# Patient Record
Sex: Female | Born: 1955 | Race: White | Hispanic: No | Smoking: Current some day smoker
Health system: Southern US, Community
[De-identification: ages and names within clinical notes are randomized; demographics above are authoritative.]

## PROBLEM LIST (undated history)

## (undated) DIAGNOSIS — R748 Abnormal levels of other serum enzymes: Secondary | ICD-10-CM

## (undated) DIAGNOSIS — E079 Disorder of thyroid, unspecified: Secondary | ICD-10-CM

## (undated) DIAGNOSIS — J189 Pneumonia, unspecified organism: Secondary | ICD-10-CM

## (undated) DIAGNOSIS — J449 Chronic obstructive pulmonary disease, unspecified: Secondary | ICD-10-CM

## (undated) DIAGNOSIS — K219 Gastro-esophageal reflux disease without esophagitis: Secondary | ICD-10-CM

## (undated) DIAGNOSIS — Z720 Tobacco use: Secondary | ICD-10-CM

## (undated) HISTORY — PX: WISDOM TOOTH EXTRACTION: SHX21

## (undated) HISTORY — PX: CYST REMOVAL HAND: SHX6279

---

## 1998-07-13 ENCOUNTER — Emergency Department (HOSPITAL_COMMUNITY): Admission: EM | Admit: 1998-07-13 | Discharge: 1998-07-14 | Payer: Self-pay | Admitting: Emergency Medicine

## 1999-01-14 ENCOUNTER — Emergency Department (HOSPITAL_COMMUNITY): Admission: EM | Admit: 1999-01-14 | Discharge: 1999-01-15 | Payer: Self-pay | Admitting: Emergency Medicine

## 2000-05-01 ENCOUNTER — Emergency Department (HOSPITAL_COMMUNITY): Admission: EM | Admit: 2000-05-01 | Discharge: 2000-05-01 | Payer: Self-pay | Admitting: Emergency Medicine

## 2000-10-03 ENCOUNTER — Emergency Department (HOSPITAL_COMMUNITY): Admission: EM | Admit: 2000-10-03 | Discharge: 2000-10-03 | Payer: Self-pay | Admitting: Emergency Medicine

## 2002-07-14 ENCOUNTER — Emergency Department (HOSPITAL_COMMUNITY): Admission: EM | Admit: 2002-07-14 | Discharge: 2002-07-14 | Payer: Self-pay | Admitting: Emergency Medicine

## 2003-02-23 ENCOUNTER — Encounter: Payer: Self-pay | Admitting: Emergency Medicine

## 2003-02-23 ENCOUNTER — Emergency Department (HOSPITAL_COMMUNITY): Admission: EM | Admit: 2003-02-23 | Discharge: 2003-02-23 | Payer: Self-pay | Admitting: Emergency Medicine

## 2004-04-19 ENCOUNTER — Emergency Department (HOSPITAL_COMMUNITY): Admission: EM | Admit: 2004-04-19 | Discharge: 2004-04-19 | Payer: Self-pay | Admitting: Emergency Medicine

## 2012-11-15 ENCOUNTER — Emergency Department (INDEPENDENT_AMBULATORY_CARE_PROVIDER_SITE_OTHER)
Admission: EM | Admit: 2012-11-15 | Discharge: 2012-11-15 | Disposition: A | Discharge status: Home / Self Care | Payer: Self-pay | Source: Home / Self Care | Attending: Emergency Medicine | Admitting: Emergency Medicine

## 2012-11-15 ENCOUNTER — Encounter (HOSPITAL_COMMUNITY): Payer: Self-pay | Admitting: *Deleted

## 2012-11-15 ENCOUNTER — Emergency Department (INDEPENDENT_AMBULATORY_CARE_PROVIDER_SITE_OTHER): Payer: Self-pay

## 2012-11-15 DIAGNOSIS — J111 Influenza due to unidentified influenza virus with other respiratory manifestations: Secondary | ICD-10-CM

## 2012-11-15 DIAGNOSIS — R6889 Other general symptoms and signs: Secondary | ICD-10-CM

## 2012-11-15 HISTORY — DX: Abnormal levels of other serum enzymes: R74.8

## 2012-11-15 HISTORY — DX: Disorder of thyroid, unspecified: E07.9

## 2012-11-15 MED ORDER — ALBUTEROL SULFATE (5 MG/ML) 0.5% IN NEBU
INHALATION_SOLUTION | RESPIRATORY_TRACT | Status: AC
  Filled 2012-11-15: qty 0.5

## 2012-11-15 MED ORDER — ALBUTEROL SULFATE HFA 108 (90 BASE) MCG/ACT IN AERS: 1.0000 | INHALATION_SPRAY | Freq: Four times a day (QID) | RESPIRATORY_TRACT | Status: DC | PRN

## 2012-11-15 MED ORDER — IBUPROFEN 800 MG PO TABS
ORAL_TABLET | ORAL | Status: AC
  Filled 2012-11-15: qty 1

## 2012-11-15 MED ORDER — IBUPROFEN 800 MG PO TABS
800.0000 mg | ORAL_TABLET | Freq: Once | ORAL | Status: AC
  Administered 2012-11-15: 800 mg via ORAL

## 2012-11-15 MED ORDER — ALBUTEROL SULFATE (5 MG/ML) 0.5% IN NEBU
5.0000 mg | INHALATION_SOLUTION | Freq: Once | RESPIRATORY_TRACT | Status: AC
  Administered 2012-11-15: 5 mg via RESPIRATORY_TRACT

## 2012-11-15 MED ORDER — OSELTAMIVIR PHOSPHATE 75 MG PO CAPS: 75.0000 mg | ORAL_CAPSULE | Freq: Two times a day (BID) | ORAL | Status: AC

## 2012-11-15 MED ORDER — IBUPROFEN 600 MG PO TABS: 600.0000 mg | ORAL_TABLET | Freq: Four times a day (QID) | ORAL | Status: DC | PRN

## 2012-11-15 NOTE — ED Provider Notes (Signed)
The the ophthalmologist on-call as unit you on an as you is a History     CSN: [id number]  Arrival date & time 11/15/12  1156   First MD Initiated Contact with Patient 11/15/12 1217      Chief Complaint  Patient presents with  . Influenza    (Consider location/radiation/quality/duration/timing/severity/associated sxs/prior treatment) HPI Comments: Patient presents urgent care this afternoon complaining of a dry cough bodyaches upper congestion and fever since yesterday. Have not been taken any medicines because of recently elevated liver enzymes at her doctor's following up. She denies any shortness of breath, or wheezing but does have a constant heart sound and cough but doesn't let her rest. She's also having body aches and feeling tired having a mild headache.   Patient is a 57 y.o. female presenting with flu symptoms. The history is provided by the patient.  Influenza This is a new problem. The current episode started yesterday. The problem occurs constantly. The problem has been gradually worsening. Pertinent negatives include no shortness of breath. Nothing aggravates the symptoms. She has tried nothing for the symptoms. The treatment provided no relief.    Past Medical History  Diagnosis Date  . Thyroid disease   . Elevated liver enzymes     History reviewed. No pertinent past surgical history.  No family history on file.  History  Substance Use Topics  . Smoking status: Current Every Day Smoker -- 1.0 packs/day    Types: Cigarettes  . Smokeless tobacco: Not on file  . Alcohol Use: No    OB History    Grav Para Term Preterm Abortions TAB SAB Ect Mult Living                  Review of Systems  Constitutional: Positive for fever, chills and appetite change.  HENT: Negative for neck pain and neck stiffness.   Respiratory: Positive for cough. Negative for apnea, choking, chest tightness, shortness of breath, wheezing and stridor.   Musculoskeletal: Positive for  myalgias and arthralgias.  Skin: Negative for rash and wound.  Neurological: Negative for dizziness.    Allergies  Codeine and Tylenol  Home Medications   Current Outpatient Rx  Name  Route  Sig  Dispense  Refill  . SYNTHROID PO   Oral   Take by mouth.         . ALBUTEROL SULFATE HFA 108 (90 BASE) MCG/ACT IN AERS   Inhalation   Inhale 1-2 puffs into the lungs every 6 (six) hours as needed for wheezing.   1 Inhaler   0   . IBUPROFEN 600 MG PO TABS   Oral   Take 1 tablet (600 mg total) by mouth every 6 (six) hours as needed for pain.   30 tablet   0   . OSELTAMIVIR PHOSPHATE 75 MG PO CAPS   Oral   Take 1 capsule (75 mg total) by mouth 2 (two) times daily.   10 capsule   0     BP 145/70  Pulse 120  Temp 102 F (38.9 C) (Oral)  Resp 18  SpO2 93%  Physical Exam  Nursing note and vitals reviewed. Constitutional: Vital signs are normal. She appears well-developed and well-nourished.  Non-toxic appearance. She does not have a sickly appearance. She does not appear ill. No distress.  HENT:  Mouth/Throat: Uvula is midline, oropharynx is clear and moist and mucous membranes are normal.  Eyes: Conjunctivae normal are normal.  Neck: Neck supple. No JVD present.  Cardiovascular:  Normal rate.  Exam reveals no gallop and no friction rub.   No murmur heard. Pulmonary/Chest: Effort normal. She has decreased breath sounds. She has no wheezes. She has no rhonchi. She has no rales.  Abdominal: Soft.  Lymphadenopathy:    She has no cervical adenopathy.  Neurological: She is alert.  Skin: No rash noted. No erythema.    ED Course  Procedures (including critical care time)  Labs Reviewed - No data to display Dg Chest 2 View  11/15/2012  *RADIOLOGY REPORT*  Clinical Data: Cough and fever  CHEST - 2 VIEW  Comparison: None.  Findings: Cardiomediastinal silhouette is within normal limits. The lungs are clear. No pleural effusion.  No pneumothorax.  No acute osseous abnormality.   IMPRESSION: Normal chest.   Original Report Authenticated By: Christiana Pellant, M.D.      1. Influenza-like symptoms       MDM  Patient meets criteria for ILI been started on Tamiflu encouraged use of future. Occurs to discontinue smoking as patient is a heavy smoker. Was also prescribed an albuterol inhaler and instructed about symptoms that should warrant her return for further evaluation. Patient is in no respiratory distress, BUT SYMPTOMATIC WITH UPPER RESPIRATORY SYMPTOMS.    His or her he does and  Jimmie Molly, MD 11/15/12 1435

## 2012-11-15 NOTE — ED Notes (Signed)
C/O harsh sounding cough and fever onset yesterday; unsure how high fevers have been.  Has not been taking any meds due to recent elevated liver enzymes.

## 2014-03-07 ENCOUNTER — Emergency Department (HOSPITAL_COMMUNITY)
Admission: EM | Admit: 2014-03-07 | Discharge: 2014-03-07 | Disposition: A | Discharge status: Home / Self Care | Payer: Self-pay | Attending: Emergency Medicine | Admitting: Emergency Medicine

## 2014-03-07 ENCOUNTER — Emergency Department (HOSPITAL_COMMUNITY): Payer: Self-pay

## 2014-03-07 ENCOUNTER — Encounter (HOSPITAL_COMMUNITY): Payer: Self-pay | Admitting: Emergency Medicine

## 2014-03-07 DIAGNOSIS — W1809XA Striking against other object with subsequent fall, initial encounter: Secondary | ICD-10-CM | POA: Insufficient documentation

## 2014-03-07 DIAGNOSIS — Y93E1 Activity, personal bathing and showering: Secondary | ICD-10-CM | POA: Insufficient documentation

## 2014-03-07 DIAGNOSIS — E079 Disorder of thyroid, unspecified: Secondary | ICD-10-CM | POA: Insufficient documentation

## 2014-03-07 DIAGNOSIS — W010XXA Fall on same level from slipping, tripping and stumbling without subsequent striking against object, initial encounter: Secondary | ICD-10-CM | POA: Insufficient documentation

## 2014-03-07 DIAGNOSIS — Y92009 Unspecified place in unspecified non-institutional (private) residence as the place of occurrence of the external cause: Secondary | ICD-10-CM | POA: Insufficient documentation

## 2014-03-07 DIAGNOSIS — M940 Chondrocostal junction syndrome [Tietze]: Secondary | ICD-10-CM | POA: Insufficient documentation

## 2014-03-07 DIAGNOSIS — F17200 Nicotine dependence, unspecified, uncomplicated: Secondary | ICD-10-CM | POA: Insufficient documentation

## 2014-03-07 DIAGNOSIS — Z79899 Other long term (current) drug therapy: Secondary | ICD-10-CM | POA: Insufficient documentation

## 2014-03-07 DIAGNOSIS — S298XXA Other specified injuries of thorax, initial encounter: Secondary | ICD-10-CM | POA: Insufficient documentation

## 2014-03-07 MED ORDER — TRAMADOL HCL 50 MG PO TABS: 50.0000 mg | ORAL_TABLET | Freq: Four times a day (QID) | ORAL | Status: DC | PRN

## 2014-03-07 NOTE — ED Provider Notes (Signed)
CSN: [id number]     Arrival date & time 03/07/14  1856 History   This chart was scribed for Domenic Moras by Lovena Le Day, ED scribe. This patient was seen in room WTR6/WTR6 and the patient's care was started at [place].  Chief Complaint  Patient presents with  . Fall   The history is provided by the patient. No language interpreter was used.   HPI Comments: Carolyn Johns is a 58 y.o. female who presents to the Emergency Department complaining of stabbing left lateral rib pain after she slipped and fell in her bathroom on a wet floor against the commode 8 days ago, initially had minimal pain but over the past few days has developed w/gradually worsening pain to her left lateral ribs. She describes the pain as someone stabbing and twisting of a knife. She states taking a deep breath or coughing increases her rib pain. She states she has taken ibuprofen with no relief to her symptoms. She denies cough, SOB, back pain, or abdominal pain. She is a current 1ppd smoker. She denies hx of COPD or emphysema.   Past Medical History  Diagnosis Date  . Thyroid disease   . Elevated liver enzymes    History reviewed. No pertinent past surgical history. No family history on file. History  Substance Use Topics  . Smoking status: Current Every Day Smoker -- 1.00 packs/day    Types: Cigarettes  . Smokeless tobacco: Not on file  . Alcohol Use: No   OB History   Grav Para Term Preterm Abortions TAB SAB Ect Mult Living                 Review of Systems  Constitutional: Negative for fever and chills.  Respiratory: Negative for cough and shortness of breath.   Cardiovascular: Negative for chest pain.  Gastrointestinal: Negative for nausea, vomiting and abdominal pain.  Musculoskeletal: Negative for back pain.       Left rib pain  Skin: Negative for rash.  All other systems reviewed and are negative.   Allergies  Codeine and Tylenol  Home Medications   Prior to Admission medications   Medication  Sig Start Date End Date Taking? Authorizing Provider  albuterol (PROVENTIL HFA;VENTOLIN HFA) 108 (90 BASE) MCG/ACT inhaler Inhale 1-2 puffs into the lungs every 6 (six) hours as needed for wheezing. 11/15/12   Rosana Hoes, MD  ibuprofen (ADVIL,MOTRIN) 600 MG tablet Take 1 tablet (600 mg total) by mouth every 6 (six) hours as needed for pain. 11/15/12   Rosana Hoes, MD  Levothyroxine Sodium (SYNTHROID PO) Take by mouth.    Historical Provider, MD   Triage Vitals: BP 125/65  Pulse 96  Temp(Src) 97.5 F (36.4 C) (Oral)  Resp 18  SpO2 98%  Physical Exam  Nursing note and vitals reviewed. Constitutional: She is oriented to person, place, and time. She appears well-developed and well-nourished. No distress.  HENT:  Head: Normocephalic and atraumatic.  Eyes: Conjunctivae are normal. Right eye exhibits no discharge. Left eye exhibits no discharge.  Neck: Normal range of motion.  Cardiovascular: Normal rate.   Pulmonary/Chest: Effort normal and breath sounds normal. No respiratory distress.  Musculoskeletal: Normal range of motion. She exhibits tenderness. She exhibits no edema.  Tenderness to left lateral rib lines. reproducible pain to left lateral rib lines with out emphysema, bruising or crepitus  No edema, no erythema  Neurological: She is alert and oriented to person, place, and time.  Skin: Skin is warm and dry.  Psychiatric: She  has a normal mood and affect. Thought content normal.    ED Course  Procedures (including critical care time) DIAGNOSTIC STUDIES: Oxygen Saturation is 98% on room air, normal by my interpretation.    COORDINATION OF CARE: At 65 PM Discussed treatment plan with patient which includes pain medicine and incentive spirometer, CXR. Patient agrees.   Labs Review Labs Reviewed - No data to display  Imaging Review Dg Chest 2 View  03/07/2014   CLINICAL DATA:  Status post fall 1 week ago now with left axillary region pain  EXAM: CHEST  2 VIEW  COMPARISON:  DG CHEST  2 VIEW dated 11/15/2012  FINDINGS: The lungs remain hyperinflated with hemidiaphragm flattening. Slightly increased lung markings in the retrosternal region are demonstrated on the lateral film but cannot be triangulated clearly on the frontal film though they may lie on the left. The cardiopericardial silhouette is normal in size. The pulmonary vascularity is not engorged. There is no pleural effusion or pneumothorax. The observed portions of the bony thorax exhibit no acute abnormalities.  IMPRESSION: 1. There is hyperinflation consistent with COPD. There may be minimal subsegmental atelectasis in the retrosternal region likely on the left, but it is difficult to localize this precisely on the frontal films. 2. There is no evidence of a pleural effusion or pneumothorax. No acute abnormality of the bony thorax is demonstrated. 3. If the patient's symptoms persist and remain unexplained, chest CT scanning may be the most useful next imaging step.   Electronically Signed   By: David  Martinique   On: 03/07/2014 19:50     EKG Interpretation None      MDM   Final diagnoses:  Costochondritis    BP 108/72  Pulse 70  Temp(Src) 98.1 F (36.7 C) (Oral)  Resp 21  SpO2 93%  I have reviewed nursing notes and vital signs. I personally reviewed the imaging tests through PACS system  I reviewed available ER/hospitalization records thought the EMR   I personally performed the services described in this documentation, which was scribed in my presence. The recorded information has been reviewed and is accurate.      Domenic Moras, PA-C 03/08/14 2003

## 2014-03-07 NOTE — Discharge Instructions (Signed)
Take ibuprofen and ultram for pain.  Use a pillow and press against chest when you cough to decrease discomfort.  Use incentive spirometer 10 times every 3-4 hrs for the next 5 days to decrease risk of developing lung infection from shallow breathing.  Follow up with your doctor for further care.  Avoid smoking.  Costochondritis Costochondritis, sometimes called Tietze syndrome, is a swelling and irritation (inflammation) of the tissue (cartilage) that connects your ribs with your breastbone (sternum). It causes pain in the chest and rib area. Costochondritis usually goes away on its own over time. It can take up to 6 weeks or longer to get better, especially if you are unable to limit your activities. CAUSES  Some cases of costochondritis have no known cause. Possible causes include:  Injury (trauma).  Exercise or activity such as lifting.  Severe coughing. SIGNS AND SYMPTOMS  Pain and tenderness in the chest and rib area.  Pain that gets worse when coughing or taking deep breaths.  Pain that gets worse with specific movements. DIAGNOSIS  Your health care provider will do a physical exam and ask about your symptoms. Chest X-rays or other tests may be done to rule out other problems. TREATMENT  Costochondritis usually goes away on its own over time. Your health care provider may prescribe medicine to help relieve pain. HOME CARE INSTRUCTIONS   Avoid exhausting physical activity. Try not to strain your ribs during normal activity. This would include any activities using chest, abdominal, and side muscles, especially if heavy weights are used.  Apply ice to the affected area for the first 2 days after the pain begins.  Put ice in a plastic bag.  Place a towel between your skin and the bag.  Leave the ice on for 20 minutes, 2 3 times a day.  Only take over-the-counter or prescription medicines as directed by your health care provider. SEEK MEDICAL CARE IF:  You have redness or  swelling at the rib joints. These are signs of infection.  Your pain does not go away despite rest or medicine. SEEK IMMEDIATE MEDICAL CARE IF:   Your pain increases or you are very uncomfortable.  You have shortness of breath or difficulty breathing.  You cough up blood.  You have worse chest pains, sweating, or vomiting.  You have a fever or persistent symptoms for more than 2 3 days.  You have a fever and your symptoms suddenly get worse. MAKE SURE YOU:   Understand these instructions.  Will watch your condition.  Will get help right away if you are not doing well or get worse. Document Released: 08/07/2005 Document Revised: 08/18/2013 Document Reviewed: 06/01/2013 [facility] Patient Information 2014 [place].

## 2014-03-07 NOTE — ED Notes (Signed)
Per pt, states she fell on left side-feels like she cracked a rib-pain with inspiration

## 2014-03-09 NOTE — ED Provider Notes (Signed)
Medical screening examination/treatment/procedure(s) were performed by non-physician practitioner and as supervising physician I was immediately available for consultation/collaboration.    Eshan Trupiano D Jerusalen Mateja, MD 03/09/14 0034 

## 2015-08-16 ENCOUNTER — Emergency Department (HOSPITAL_COMMUNITY)
Admission: EM | Admit: 2015-08-16 | Discharge: 2015-08-16 | Disposition: A | Discharge status: Home / Self Care | Payer: Self-pay | Attending: Emergency Medicine | Admitting: Emergency Medicine

## 2015-08-16 ENCOUNTER — Encounter (HOSPITAL_COMMUNITY): Payer: Self-pay | Admitting: *Deleted

## 2015-08-16 DIAGNOSIS — M5441 Lumbago with sciatica, right side: Secondary | ICD-10-CM | POA: Insufficient documentation

## 2015-08-16 DIAGNOSIS — Z79899 Other long term (current) drug therapy: Secondary | ICD-10-CM | POA: Insufficient documentation

## 2015-08-16 DIAGNOSIS — E039 Hypothyroidism, unspecified: Secondary | ICD-10-CM | POA: Insufficient documentation

## 2015-08-16 DIAGNOSIS — Z720 Tobacco use: Secondary | ICD-10-CM | POA: Insufficient documentation

## 2015-08-16 LAB — URINALYSIS, ROUTINE W REFLEX MICROSCOPIC
BILIRUBIN URINE: NEGATIVE
GLUCOSE, UA: NEGATIVE mg/dL
KETONES UR: NEGATIVE mg/dL
Leukocytes, UA: NEGATIVE
Nitrite: NEGATIVE
PROTEIN: NEGATIVE mg/dL
Specific Gravity, Urine: 1.006 (ref 1.005–1.030)
Urobilinogen, UA: 0.2 mg/dL (ref 0.0–1.0)
pH: 5.5 (ref 5.0–8.0)

## 2015-08-16 LAB — URINE MICROSCOPIC-ADD ON

## 2015-08-16 MED ORDER — PREDNISONE 10 MG PO TABS: ORAL_TABLET | ORAL | Status: DC

## 2015-08-16 MED ORDER — TRAMADOL HCL 50 MG PO TABS: 50.0000 mg | ORAL_TABLET | Freq: Four times a day (QID) | ORAL | Status: DC | PRN

## 2015-08-16 MED ORDER — METHOCARBAMOL 500 MG PO TABS: 500.0000 mg | ORAL_TABLET | Freq: Two times a day (BID) | ORAL | Status: DC

## 2015-08-16 NOTE — Discharge Instructions (Signed)
Prednisone as prescribed until all gone for inflammation. Tramadol for severe pain. Robaxin for muscle spasms. Follow up with primary care doctor for recheck next week.   Lumbosacral Radiculopathy Lumbosacral radiculopathy is a condition that involves the spinal nerves and nerve roots in the low back and bottom of the spine. The condition develops when these nerves and nerve roots move out of place or become inflamed and cause symptoms. CAUSES This condition may be caused by:  Pressure from a disk that bulges out of place (herniated disk). A disk is a plate of cartilage that separates bones in the spine.  Disk degeneration.  A narrowing of the bones of the lower back (spinal stenosis).  A tumor.  An infection.  An injury that places sudden pressure on the disks that cushion the bones of your lower spine. RISK FACTORS This condition is more likely to develop in:  Males aged 30-50 years.  Females aged 64-60 years.  People who lift improperly.  People who are overweight or live a sedentary lifestyle.  People who smoke.  People who perform repetitive activities that strain the spine. SYMPTOMS Symptoms of this condition include:  Pain that goes down from the back into the legs (sciatica). This is the most common symptom. The pain may be worse with sitting, coughing, or sneezing.  Pain and numbness in the arms and legs.  Muscle weakness.  Tingling.  Loss of bladder control or bowel control. DIAGNOSIS This condition is diagnosed with a physical exam and medical history. If the pain is lasting, you may have tests, such as:  MRI scan.  X-ray.  CT scan.  Myelogram.  Nerve conduction study. TREATMENT This condition is often treated with:  Hot packs and ice applied to affected areas.  Stretches to improve flexibility.  Exercises to strengthen back muscles.  Physical therapy.  Pain medicine.  A steroid injection in the spine. In some cases, no treatment is  needed. If the condition is long-lasting (chronic), or if symptoms are severe, treatment may involve surgery or lifestyle changes, such as following a weight loss plan. HOME CARE INSTRUCTIONS Medicines  Take medicines only as directed by your health care provider.  Do not drive or operate heavy machinery while taking pain medicine. Injury Care  Apply a heat pack to the injured area as directed by your health care provider.  Apply ice to the affected area:  Put ice in a plastic bag.  Place a towel between your skin and the bag.  Leave the ice on for 20-30 minutes, every 2 hours while you are awake or as needed. Or, leave the ice on for as long as directed by your health care provider. Other Instructions  If you were shown how to do any exercises or stretches, do them as directed by your health care provider.  If your health care provider prescribed a diet or exercise program, follow it as directed.  Keep all follow-up visits as directed by your health care provider. This is important. SEEK MEDICAL CARE IF:  Your pain does not improve over time even when taking pain medicines. SEEK IMMEDIATE MEDICAL CARE IF:  Your develop severe pain.  Your pain suddenly gets worse.  You develop increasing weakness in your legs.  You lose the ability to control your bladder or bowel.  You have difficulty walking or balancing.  You have a fever.   This information is not intended to replace advice given to you by your health care provider. Make sure you discuss any  questions you have with your health care provider.   Document Released: 10/28/2005 Document Revised: 03/14/2015 Document Reviewed: 10/24/2014 Elsevier Interactive Patient Education [employer].

## 2015-08-16 NOTE — ED Notes (Signed)
PT reports back pain on RT side for 2 weeks . Pt reports the pain comes and goes.

## 2015-08-16 NOTE — ED Provider Notes (Signed)
CSN: [id number]     Arrival date & time 08/16/15  0808 History   First MD Initiated Contact with Patient 08/16/15 0815     Chief Complaint  Patient presents with  . Back Pain     (Consider location/radiation/quality/duration/timing/severity/associated sxs/prior Treatment) HPI Carolyn Johns is a 59 y.o. female presents to emergency department complaining of right lower back pain. Patient states pain started 2 weeks ago, comes and goes. Yesterday was unrelieved with the [facility] powder. Patient states she is unable to sleep all night due to pain. She states the most comfortable position is standing up, pain is worsened with sitting. Pain radiates from right lower back into the right groin and sometimes down the right leg. Denies any urinary symptoms. No difficulty urinating or controlling her bowels. Denies fever or chills. Denies numbness or weakness in extremities. No difficulty ambulating, just pain. Denies history of back issues. Denies abdominal pain, nausea, vomiting.  Past Medical History  Diagnosis Date  . Thyroid disease   . Elevated liver enzymes    History reviewed. No pertinent past surgical history. History reviewed. No pertinent family history. Social History  Substance Use Topics  . Smoking status: Current Every Day Smoker -- 1.00 packs/day    Types: Cigarettes  . Smokeless tobacco: None  . Alcohol Use: No   OB History    No data available     Review of Systems  Constitutional: Negative for fever and chills.  Respiratory: Negative for cough, chest tightness and shortness of breath.   Cardiovascular: Negative for chest pain, palpitations and leg swelling.  Gastrointestinal: Negative for nausea, vomiting, abdominal pain and diarrhea.  Genitourinary: Positive for flank pain. Negative for dysuria, vaginal bleeding, vaginal discharge, vaginal pain and pelvic pain.  Musculoskeletal: Positive for back pain. Negative for myalgias, neck pain and neck stiffness.  Skin: Negative  for rash.  Neurological: Negative for weakness and numbness.  All other systems reviewed and are negative.     Allergies  Codeine and Tylenol  Home Medications   Prior to Admission medications   Medication Sig Start Date End Date Taking? Authorizing Provider  albuterol (PROVENTIL HFA;VENTOLIN HFA) 108 (90 BASE) MCG/ACT inhaler Inhale 1-2 puffs into the lungs every 6 (six) hours as needed for wheezing. 11/15/12   Rosana Hoes, MD  ibuprofen (ADVIL,MOTRIN) 600 MG tablet Take 1 tablet (600 mg total) by mouth every 6 (six) hours as needed for pain. 11/15/12   Rosana Hoes, MD  Levothyroxine Sodium (SYNTHROID PO) Take by mouth.    Historical Provider, MD  traMADol (ULTRAM) 50 MG tablet Take 1 tablet (50 mg total) by mouth every 6 (six) hours as needed. 03/07/14   Domenic Moras, PA-C   BP 132/62 mmHg  Pulse 83  Resp 18  SpO2 100% Physical Exam  Constitutional: She is oriented to person, place, and time. She appears well-developed and well-nourished. No distress.  HENT:  Head: Normocephalic.  Eyes: Conjunctivae are normal.  Neck: Neck supple.  Cardiovascular: Normal rate, regular rhythm and normal heart sounds.   Pulmonary/Chest: Effort normal and breath sounds normal. No respiratory distress. She has no wheezes. She has no rales.  Abdominal: Soft. Bowel sounds are normal. She exhibits no distension. There is no tenderness. There is no rebound.  Right cva tenderness  Musculoskeletal: She exhibits no edema.  No midline lumbar spine tenderness. Tender palpation in the right SI joint and right buttock. Pain with right straight leg raise  Neurological: She is alert and oriented to person, place, and  time.  5/5 and equal lower extremity strength. 2+ and equal patellar reflexes bilaterally. Pt able to dorsiflex bilateral toes and feet with good strength against resistance. Equal sensation bilaterally over thighs and lower legs.   Skin: Skin is warm and dry.  Psychiatric: She has a normal mood and  affect. Her behavior is normal.  Nursing note and vitals reviewed.   ED Course  Procedures (including critical care time) Labs Review Labs Reviewed  URINALYSIS, ROUTINE W REFLEX MICROSCOPIC (NOT AT [facility]) - Abnormal; Notable for the following:    Hgb urine dipstick SMALL (*)    All other components within normal limits  URINE MICROSCOPIC-ADD ON    Imaging Review No results found. I have personally reviewed and evaluated these images and lab results as part of my medical decision-making.   EKG Interpretation None      MDM   Final diagnoses:  Right-sided low back pain with right-sided sciatica    Pt with lower back pain, intermittent for 2 wks. No injuries. neurovascularly intact. No signs of cauda equina. UA negative. Most likely radicular pain. Will try prednisone, ultram, robaxin, follow up with pcp. Return precautions discussed.    Filed Vitals:   08/16/15 0813  BP: 132/62  Pulse: 83  Temp: 98.4 F (36.9 C)  TempSrc: Oral  Resp: 18  SpO2: 100%       Jeannett Senior, PA-C 08/16/15 [place], MD 08/16/15 1159

## 2015-08-16 NOTE — ED Notes (Signed)
Declined W/C at D/C and was escorted to lobby by RN. 

## 2018-01-15 ENCOUNTER — Emergency Department (HOSPITAL_COMMUNITY): Payer: Self-pay

## 2018-01-15 ENCOUNTER — Observation Stay (HOSPITAL_COMMUNITY): Payer: Self-pay

## 2018-01-15 ENCOUNTER — Observation Stay (HOSPITAL_COMMUNITY)
Admission: EM | Admit: 2018-01-15 | Discharge: 2018-01-17 | Disposition: A | Discharge status: Home / Self Care | Payer: Self-pay | Attending: Internal Medicine | Admitting: Internal Medicine

## 2018-01-15 ENCOUNTER — Encounter (HOSPITAL_COMMUNITY): Payer: Self-pay | Admitting: Internal Medicine

## 2018-01-15 DIAGNOSIS — R7989 Other specified abnormal findings of blood chemistry: Secondary | ICD-10-CM | POA: Diagnosis present

## 2018-01-15 DIAGNOSIS — I2510 Atherosclerotic heart disease of native coronary artery without angina pectoris: Secondary | ICD-10-CM

## 2018-01-15 DIAGNOSIS — E039 Hypothyroidism, unspecified: Secondary | ICD-10-CM | POA: Diagnosis present

## 2018-01-15 DIAGNOSIS — R778 Other specified abnormalities of plasma proteins: Secondary | ICD-10-CM | POA: Diagnosis present

## 2018-01-15 DIAGNOSIS — I951 Orthostatic hypotension: Principal | ICD-10-CM | POA: Insufficient documentation

## 2018-01-15 DIAGNOSIS — Z720 Tobacco use: Secondary | ICD-10-CM | POA: Diagnosis present

## 2018-01-15 DIAGNOSIS — R748 Abnormal levels of other serum enzymes: Secondary | ICD-10-CM | POA: Insufficient documentation

## 2018-01-15 DIAGNOSIS — Z79890 Hormone replacement therapy (postmenopausal): Secondary | ICD-10-CM | POA: Insufficient documentation

## 2018-01-15 DIAGNOSIS — R55 Syncope and collapse: Secondary | ICD-10-CM | POA: Diagnosis present

## 2018-01-15 DIAGNOSIS — E785 Hyperlipidemia, unspecified: Secondary | ICD-10-CM | POA: Insufficient documentation

## 2018-01-15 DIAGNOSIS — R918 Other nonspecific abnormal finding of lung field: Secondary | ICD-10-CM | POA: Diagnosis present

## 2018-01-15 DIAGNOSIS — N3000 Acute cystitis without hematuria: Secondary | ICD-10-CM

## 2018-01-15 DIAGNOSIS — I700 Atherosclerosis of aorta: Secondary | ICD-10-CM | POA: Insufficient documentation

## 2018-01-15 DIAGNOSIS — R42 Dizziness and giddiness: Secondary | ICD-10-CM

## 2018-01-15 DIAGNOSIS — F17210 Nicotine dependence, cigarettes, uncomplicated: Secondary | ICD-10-CM | POA: Insufficient documentation

## 2018-01-15 DIAGNOSIS — N390 Urinary tract infection, site not specified: Secondary | ICD-10-CM | POA: Diagnosis present

## 2018-01-15 HISTORY — DX: Tobacco use: Z72.0

## 2018-01-15 HISTORY — DX: Hypothyroidism, unspecified: E03.9

## 2018-01-15 LAB — BASIC METABOLIC PANEL
Anion gap: 10 (ref 5–15)
BUN: 9 mg/dL (ref 6–20)
CO2: 24 mmol/L (ref 22–32)
Calcium: 9.1 mg/dL (ref 8.9–10.3)
Chloride: 105 mmol/L (ref 101–111)
Creatinine, Ser: 0.73 mg/dL (ref 0.44–1.00)
GFR calc Af Amer: >60 mL/min (ref >60)
GFR calc non Af Amer: >60 mL/min (ref >60)
Glucose, Bld: 104 mg/dL — ABNORMAL HIGH (ref 65–99)
Potassium: 4 mmol/L (ref 3.5–5.1)
Sodium: 139 mmol/L (ref 135–145)

## 2018-01-15 LAB — URINALYSIS, ROUTINE W REFLEX MICROSCOPIC
Bilirubin Urine: NEGATIVE
Glucose, UA: NEGATIVE mg/dL
Ketones, ur: NEGATIVE mg/dL
Nitrite: NEGATIVE
Protein, ur: NEGATIVE mg/dL
Specific Gravity, Urine: 1.009 (ref 1.005–1.030)
pH: 5 (ref 5.0–8.0)

## 2018-01-15 LAB — I-STAT TROPONIN, ED: TROPONIN I, POC: 0.13 ng/mL — AB (ref 0.00–0.08)

## 2018-01-15 LAB — CBC
HEMATOCRIT: 39.6 % (ref 36.0–46.0)
HEMOGLOBIN: 12.9 g/dL (ref 12.0–15.0)
MCH: 29.8 pg (ref 26.0–34.0)
MCHC: 32.6 g/dL (ref 30.0–36.0)
MCV: 91.5 fL (ref 78.0–100.0)
Platelets: 237 10*3/uL (ref 150–400)
RBC: 4.33 MIL/uL (ref 3.87–5.11)
RDW: 13.9 % (ref 11.5–15.5)
WBC: 6.4 10*3/uL (ref 4.0–10.5)

## 2018-01-15 LAB — D-DIMER, QUANTITATIVE: D-Dimer, Quant: 1.31 ug/mL-FEU — ABNORMAL HIGH (ref 0.00–0.50)

## 2018-01-15 MED ORDER — MORPHINE SULFATE (PF) 4 MG/ML IV SOLN: 2.0000 mg | INTRAVENOUS | Status: DC | PRN

## 2018-01-15 MED ORDER — SODIUM CHLORIDE 0.9 % IV SOLN
INTRAVENOUS | Status: DC
  Administered 2018-01-15 – 2018-01-17 (×3): via INTRAVENOUS

## 2018-01-15 MED ORDER — ENOXAPARIN SODIUM 40 MG/0.4ML ~~LOC~~ SOLN
40.0000 mg | SUBCUTANEOUS | Status: DC
  Administered 2018-01-15 – 2018-01-16 (×2): 40 mg via SUBCUTANEOUS
  Filled 2018-01-15 (×2): qty 0.4

## 2018-01-15 MED ORDER — MELATONIN 3 MG PO TABS
9.0000 mg | ORAL_TABLET | Freq: Every evening | ORAL | Status: DC | PRN
  Filled 2018-01-15: qty 3

## 2018-01-15 MED ORDER — LEVOTHYROXINE SODIUM 100 MCG PO TABS
100.0000 ug | ORAL_TABLET | Freq: Every day | ORAL | Status: DC
  Administered 2018-01-16 – 2018-01-17 (×2): 100 ug via ORAL
  Filled 2018-01-15 (×3): qty 1

## 2018-01-15 MED ORDER — NICOTINE 21 MG/24HR TD PT24
21.0000 mg | MEDICATED_PATCH | Freq: Every day | TRANSDERMAL | Status: DC
  Administered 2018-01-15 – 2018-01-17 (×3): 21 mg via TRANSDERMAL
  Filled 2018-01-15 (×3): qty 1

## 2018-01-15 MED ORDER — NITROGLYCERIN 0.4 MG SL SUBL: 0.4000 mg | SUBLINGUAL_TABLET | SUBLINGUAL | Status: DC | PRN

## 2018-01-15 MED ORDER — ASPIRIN 325 MG PO TABS
325.0000 mg | ORAL_TABLET | Freq: Every day | ORAL | Status: DC
  Administered 2018-01-15 – 2018-01-17 (×3): 325 mg via ORAL
  Filled 2018-01-15 (×3): qty 1

## 2018-01-15 MED ORDER — SODIUM CHLORIDE 0.9 % IV SOLN
1.0000 g | INTRAVENOUS | Status: DC
  Administered 2018-01-16 (×2): 1 g via INTRAVENOUS
  Filled 2018-01-15 (×3): qty 10

## 2018-01-15 MED ORDER — IOPAMIDOL (ISOVUE-370) INJECTION 76%
INTRAVENOUS | Status: AC
  Administered 2018-01-15: 100 mL
  Filled 2018-01-15: qty 100

## 2018-01-15 MED ORDER — ONDANSETRON HCL 4 MG/2ML IJ SOLN: 4.0000 mg | Freq: Three times a day (TID) | INTRAMUSCULAR | Status: DC | PRN

## 2018-01-15 MED ORDER — ALPRAZOLAM 0.25 MG PO TABS
0.2500 mg | ORAL_TABLET | Freq: Two times a day (BID) | ORAL | Status: DC | PRN
  Administered 2018-01-15 – 2018-01-16 (×2): 0.25 mg via ORAL
  Filled 2018-01-15 (×2): qty 1

## 2018-01-15 MED ORDER — SODIUM CHLORIDE 0.9 % IV BOLUS (SEPSIS)
1000.0000 mL | Freq: Once | INTRAVENOUS | Status: AC
  Administered 2018-01-15: 1000 mL via INTRAVENOUS

## 2018-01-15 MED ORDER — IBUPROFEN 200 MG PO TABS: 200.0000 mg | ORAL_TABLET | Freq: Four times a day (QID) | ORAL | Status: DC | PRN

## 2018-01-15 MED ORDER — ATORVASTATIN CALCIUM 40 MG PO TABS
40.0000 mg | ORAL_TABLET | Freq: Every day | ORAL | Status: DC
  Administered 2018-01-15 – 2018-01-16 (×2): 40 mg via ORAL
  Filled 2018-01-15 (×2): qty 1

## 2018-01-15 NOTE — H&P (Signed)
History and Physical    Carolyn Johns SFK:[id number] DOB: [birth year] DOA: 01/15/2018  Referring MD/NP/PA:   PCP: Leonard Downing, MD   Patient coming from:  The patient is coming from home.  At baseline, pt is independent for most of ADL.   Chief Complaint: near syncope, burning on urination.   HPI: Carolyn Johns is a 62 y.o. female with medical history significant of tobacco abuse, elevated liver enzymes and hypothyroidism, who presents with near syncope.  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 She states that she almost passed out, but did not. her  symptoms lasted for about 20 minutes. Patient does not have unilateral weakness, numbness or tingling to extremities. No vision change or hearing loss. No slurred speech or facial droop. Patient states that she has burning on urination, but no dysuria or urinary frequency.Patient does not have chest pain, cough, shortness breath, fever or chills. No nausea, vomiting, diarrhea or abdominal pain.  ED Course: pt was found to have positive orthostatic vital sign, positive urinalysis with large amount of leukocyte, troponin 0.13, electrolytes renal function okay, tachycardia, oxygen saturation 93-97% on room air, normal temperature. D-dimer positive at 1.31, but CT angiograms negative for PE. CT head is negative. Pt is placed on tele bed for obs.  CTA of chest showed: 1. No evidence for acute pulmonary embolus. 2. Multiple bilateral pulmonary nodules, largest measures up to 8 Mm. 3. Aortic Atherosclerosis (ICD10-I70.0) and Emphysema (ICD10-J43.9).   Review of Systems:   General: no fevers, chills, no body weight gain, has fatigue HEENT: no blurry vision, hearing changes or sore throat Respiratory: no dyspnea, coughing, wheezing CV: no chest pain, no palpitations GI: no nausea, vomiting, abdominal pain, diarrhea,  constipation GU: no dysuria, has burning on urination, no increased urinary frequency, hematuria  Ext: no leg edema Neuro: no unilateral weakness, numbness, or tingling, no vision change or hearing loss. Has dizziness and lightheadedness. Skin: no rash, no skin tear. MSK: No muscle spasm, no deformity, no limitation of range of movement in spin Heme: No easy bruising.  Travel history: No recent long distant travel.  Allergy:  Allergies  Allergen Reactions  . Codeine     GI upset    Past Medical History:  Diagnosis Date  . Elevated liver enzymes   . Thyroid disease   . Tobacco abuse     Past Surgical History:  Procedure Laterality Date  . CYST REMOVAL HAND      Social History:  reports that she has been smoking cigarettes.  She has been smoking about 1.00 pack per day. She does not have any smokeless tobacco history on file. She reports that she does not drink alcohol or use drugs.  Family History:  Family History  Problem Relation Age of Onset  . Stroke Father   . Seizures Father      Prior to Admission medications   Medication Sig Start Date End Date Taking? Authorizing Provider  levothyroxine (SYNTHROID, LEVOTHROID) 100 MCG tablet Take 100 mcg by mouth daily. 12/24/17  Yes [provider]  Melatonin 5 MG TABS Take 10 mg by mouth at bedtime as needed (sleep).   Yes [provider]  albuterol (PROVENTIL HFA;VENTOLIN HFA) 108 (90 BASE) MCG/ACT inhaler Inhale 1-2 puffs into the lungs every 6 (six) hours as needed for wheezing. Patient not taking: Reported on 01/15/2018 11/15/12   Rosana Hoes,  MD  ibuprofen (ADVIL,MOTRIN) 600 MG tablet Take 1 tablet (600 mg total) by mouth every 6 (six) hours as needed for pain. Patient not taking: Reported on 01/15/2018 11/15/12   Rosana Hoes, MD  methocarbamol (ROBAXIN) 500 MG tablet Take 1 tablet (500 mg total) by mouth 2 (two) times daily. Patient not taking: Reported on 01/15/2018 08/16/15   Jeannett Senior, PA-C  predniSONE  (DELTASONE) 10 MG tablet Take 5 tab day 1, take 4 tab day 2, take 3 tab day 3, take 2 tab day 4, and take 1 tab day 5 Patient not taking: Reported on 01/15/2018 08/16/15   Jeannett Senior, PA-C  traMADol (ULTRAM) 50 MG tablet Take 1 tablet (50 mg total) by mouth every 6 (six) hours as needed. Patient not taking: Reported on 01/15/2018 03/07/14   Domenic Moras, PA-C  traMADol (ULTRAM) 50 MG tablet Take 1 tablet (50 mg total) by mouth every 6 (six) hours as needed. Patient not taking: Reported on 01/15/2018 08/16/15   Jeannett Senior, PA-C    Physical Exam: Vitals:   01/15/18 2000 01/15/18 2030 01/15/18 2100 01/15/18 2149  BP: 128/66 130/82 125/85 122/62  Pulse: 66 70 78 71  Resp: 19 18 (!) 21 20  Temp:    98.7 F (37.1 C)  TempSrc:    Oral  SpO2: 97% 96% 97% 96%  Weight:    55.8 kg (123 lb 1.6 oz)  Height:    5' 3.5" (1.613 m)   General: Not in acute distress HEENT:       Eyes: PERRL, EOMI, no scleral icterus.       ENT: No discharge from the ears and nose, no pharynx injection, no tonsillar enlargement.        Neck: No JVD, no bruit, no mass felt. Heme: No neck lymph node enlargement. Cardiac: S1/S2, RRR, No murmurs, No gallops or rubs. Respiratory:  No rales, wheezing, rhonchi or rubs. GI: Soft, nondistended, nontender, no rebound pain, no organomegaly, BS present. GU: No hematuria Ext: No pitting leg edema bilaterally. 2+DP/PT pulse bilaterally. Musculoskeletal: No joint deformities, No joint redness or warmth, no limitation of ROM in spin. Skin: No rashes.  Neuro: Alert, oriented X3, cranial nerves II-XII grossly intact, moves all extremities normally. Muscle strength 5/5 in all extremities, sensation to light touch intact. Brachial reflex 2+ bilaterally.  Psych: Patient is not psychotic, no suicidal or hemocidal ideation.  Labs on Admission: I have personally reviewed following labs and imaging studies  CBC: Recent Labs  Lab 01/15/18 1517  WBC 6.4  HGB 12.9  HCT 39.6    MCV 91.5  PLT 419   Basic Metabolic Panel: Recent Labs  Lab 01/15/18 1517  NA 139  K 4.0  CL 105  CO2 24  GLUCOSE 104*  BUN 9  CREATININE 0.73  CALCIUM 9.1   GFR: Estimated Creatinine Clearance: 64.1 mL/min (by C-G formula based on SCr of 0.73 mg/dL). Liver Function Tests: No results for input(s): AST, ALT, ALKPHOS, BILITOT, PROT, ALBUMIN in the last 168 hours. No results for input(s): LIPASE, AMYLASE in the last 168 hours. No results for input(s): AMMONIA in the last 168 hours. Coagulation Profile: No results for input(s): INR, PROTIME in the last 168 hours. Cardiac Enzymes: No results for input(s): CKTOTAL, CKMB, CKMBINDEX, TROPONINI in the last 168 hours. BNP (last 3 results) No results for input(s): PROBNP in the last 8760 hours. HbA1C: No results for input(s): HGBA1C in the last 72 hours. CBG: No results for input(s): GLUCAP in the last  168 hours. Lipid Profile: No results for input(s): CHOL, HDL, LDLCALC, TRIG, CHOLHDL, LDLDIRECT in the last 72 hours. Thyroid Function Tests: No results for input(s): TSH, T4TOTAL, FREET4, T3FREE, THYROIDAB in the last 72 hours. Anemia Panel: No results for input(s): VITAMINB12, FOLATE, FERRITIN, TIBC, IRON, RETICCTPCT in the last 72 hours. Urine analysis:    Component Value Date/Time   COLORURINE YELLOW 01/15/2018 1726   APPEARANCEUR HAZY (A) 01/15/2018 1726   LABSPEC 1.009 01/15/2018 1726   PHURINE 5.0 01/15/2018 1726   GLUCOSEU NEGATIVE 01/15/2018 1726   HGBUR MODERATE (A) 01/15/2018 1726   BILIRUBINUR NEGATIVE 01/15/2018 1726   KETONESUR NEGATIVE 01/15/2018 1726   PROTEINUR NEGATIVE 01/15/2018 1726   UROBILINOGEN 0.2 08/16/2015 0854   NITRITE NEGATIVE 01/15/2018 1726   LEUKOCYTESUR LARGE (A) 01/15/2018 1726   Sepsis Labs: @LABRCNTIP (procalcitonin:4,lacticidven:4) )No results found for this or any previous visit (from the past 240 hour(s)).   Radiological Exams on Admission: Ct Head Wo Contrast  Result Date:  01/15/2018 CLINICAL DATA:  Syncopal episode. EXAM: CT HEAD WITHOUT CONTRAST TECHNIQUE: Contiguous axial images were obtained from the base of the skull through the vertex without intravenous contrast. COMPARISON:  None. FINDINGS: Brain: Ventricles and sulci are appropriate for patient's age. No evidence for acute cortically based infarct, intracranial hemorrhage, mass lesion or mass-effect. Vascular: Unremarkable. Skull: Intact. Sinuses/Orbits: Paranasal sinuses are well aerated. Mastoid air cells are unremarkable. Orbits are unremarkable. Other: None. IMPRESSION: No acute intracranial process. Electronically Signed   By: Lovey Newcomer M.D.   On: 01/15/2018 21:20   Ct Angio Chest Pe W/cm &/or Wo Cm  Result Date: 01/15/2018 CLINICAL DATA:  Syncopal episode.  Elevated D-dimer. EXAM: CT ANGIOGRAPHY CHEST WITH CONTRAST TECHNIQUE: Multidetector CT imaging of the chest was performed using the standard protocol during bolus administration of intravenous contrast. Multiplanar CT image reconstructions and MIPs were obtained to evaluate the vascular anatomy. CONTRAST:  55 cc Isovue 370 COMPARISON:  Chest radiograph 03/07/2014. FINDINGS: Cardiovascular: Normal heart size. No pericardial effusion. Thoracic aortic vascular calcifications. Adequate opacification of the pulmonary arterial system. No filling defect to suggest acute pulmonary embolus. Mediastinum/Nodes: No enlarged axillary, mediastinal or hilar lymphadenopathy. Normal esophagus. Lungs/Pleura: Central airways are patent. Dependent atelectasis within the bilateral lower lobes. Multiple bilateral pulmonary nodules. A few reference nodules are as follows including a 5 mm left upper lobe nodule (image 39; series 7), a 5 mm left upper lobe nodule (image 28; series 7) an 8 mm right upper lobe nodule (image 30; series 7), a 6 mm right upper lobe nodule (image 45; series 7) a 4 mm right lower lobe nodule (image 73; series 7) and a 4 mm left lower lobe nodule (image 61;  series 7). No pleural effusion or pneumothorax. Emphysematous change. Upper Abdomen: No acute process. Musculoskeletal: No aggressive or acute appearing osseous lesions. Thoracic spine degenerative changes. Review of the MIP images confirms the above findings. IMPRESSION: 1. No evidence for acute pulmonary embolus. 2. Multiple bilateral pulmonary nodules, largest measures up to 8 mm. Non-contrast chest CT at 3 months is recommended. If the nodules are stable at time of repeat CT, then future CT at 18-24 months (from today's scan) is considered optional for low-risk patients, but is recommended for high-risk patients. This recommendation follows the consensus statement: Guidelines for Management of Incidental Pulmonary Nodules Detected on CT Images: From the Fleischner Society 2017; Radiology 2017; 284:228-243. 3. Aortic Atherosclerosis (ICD10-I70.0) and Emphysema (ICD10-J43.9). Electronically Signed   By: Lovey Newcomer M.D.   On: 01/15/2018 21:28  EKG: Independently reviewed.  Sinus rhythm, QTC 468, anteroseptal infarction pattern, early R-wave progression, nonspecific T-wave change.  Assessment/Plan Principal Problem:   Near syncope Active Problems:   Hypothyroidism   UTI (urinary tract infection)   Elevated troponin   Tobacco abuse   Pulmonary nodules   Near syncope: Etiology is not clear, likely due to multifactorial etiology, including UTI and orthostatic status. No focal neurological findings on physical examination, less likely to have stroke. CT head is negative. CT angiogram is negative for PE. Pt has positive POC trop 0.13, but not chest pain, possibly due to demand ischemia.  -will place on tele bed for obs -IVF: 1L NS, then 125 cc/h -Pt/ot -treat UTI with Abx  UTI: -rocephin -f/u Bx and Ux  Elevated troponin: trop 0.13. No CP. Possibly due to demand ischemia. - prn Nitroglycerin, Morphine, and aspirin, lipitor  - Risk factor stratification: will check FLP, UDS and A1C  - 2d  echo - please call Card in AM - LE doppler to r/o DVT due to positive D-dimer - Inpatient non-urgent card consult order was put in Epic and message to Birdie Sons was sent out.  Hypothyroidism: Last TSH was not on record -Continue home Synthroid -Check TSH  Tobacco abuse: -Did counseling about importance of quitting smoking -Nicotine patch  Pulmonary nodules: Incidental findings on CT angiogram. -f/u with PCP  DVT ppx:  SQ Lovenox Code Status: Full code Family Communication: None at bed side.   Disposition Plan:  Anticipate discharge back to previous home environment Consults called:  none Admission status: Obs / tele         Date of Service 01/15/2018    Ivor Costa [facility] Pager [phone]  If 7PM-7AM, please contact night-coverage [email or web address] Password [facility] 01/15/2018, 11:27 PM

## 2018-01-15 NOTE — ED Notes (Signed)
Patient transported to CT 

## 2018-01-15 NOTE — ED Notes (Signed)
ED Provider at bedside. 

## 2018-01-15 NOTE — ED Provider Notes (Addendum)
[facility] EMERGENCY DEPARTMENT Provider Note   CSN: [id number] Arrival date & time: 01/15/18  1442     History   Chief Complaint Chief Complaint  Patient presents with  . Near Syncope    HPI Carolyn Johns is a 62 y.o. female.  HPI   Patient is a 62 year old female with a history of elevated liver enzymes and hypothyroidism presenting for near syncopal episode today.  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  Patient reports that she felt she was loading to the right side, and sat down.  Patient felt better after eating and drinking and the entire episode lasted approximately 20 minutes.  Patient's been a symptomatically since then.  Patient denies any changes in speech, changes in vision, weakness on one side, facial drooping, or loss of sensation in any part of her body.  Patient denies any active chest pain or shortness of breath accompanying the sensation today.  Patient denies any family history of early MI.  Patient denies any lower extremity leg swelling, recent immobilization, hospitalization, surgeries, hemoptysis, or estrogen use.  No family history of PE.  Patient denies any ischemic cardiac history or ever seeing a cardiologist. Patient does endorse 1.5 ppd of cigarettes.  Past Medical History:  Diagnosis Date  . Elevated liver enzymes   . Thyroid disease     There are no active problems to display for this patient.   No past surgical history on file.  OB History    No data available       Home Medications    Prior to Admission medications   Medication Sig Start Date End Date Taking? Authorizing Provider  levothyroxine (SYNTHROID, LEVOTHROID) 100 MCG tablet Take 100 mcg by mouth daily. 12/24/17  Yes [provider]  Melatonin 5 MG TABS Take 10 mg by mouth at bedtime as needed (sleep).   Yes [provider]    albuterol (PROVENTIL HFA;VENTOLIN HFA) 108 (90 BASE) MCG/ACT inhaler Inhale 1-2 puffs into the lungs every 6 (six) hours as needed for wheezing. Patient not taking: Reported on 01/15/2018 11/15/12   Rosana Hoes, MD  ibuprofen (ADVIL,MOTRIN) 600 MG tablet Take 1 tablet (600 mg total) by mouth every 6 (six) hours as needed for pain. Patient not taking: Reported on 01/15/2018 11/15/12   Rosana Hoes, MD  methocarbamol (ROBAXIN) 500 MG tablet Take 1 tablet (500 mg total) by mouth 2 (two) times daily. Patient not taking: Reported on 01/15/2018 08/16/15   Jeannett Senior, PA-C  predniSONE (DELTASONE) 10 MG tablet Take 5 tab day 1, take 4 tab day 2, take 3 tab day 3, take 2 tab day 4, and take 1 tab day 5 Patient not taking: Reported on 01/15/2018 08/16/15   Jeannett Senior, PA-C  traMADol (ULTRAM) 50 MG tablet Take 1 tablet (50 mg total) by mouth every 6 (six) hours as needed. Patient not taking: Reported on 01/15/2018 03/07/14   Domenic Moras, PA-C  traMADol (ULTRAM) 50 MG tablet Take 1 tablet (50 mg total) by mouth every 6 (six) hours as needed. Patient not taking: Reported on 01/15/2018 08/16/15   Jeannett Senior, PA-C    Family History No family history on file.  Social History Social History   Tobacco Use  . Smoking status: Current Every Day Smoker    Packs/day: 1.00    Types: Cigarettes  Substance Use Topics  . Alcohol  use: No  . Drug use: No     Allergies   Codeine   Review of Systems Review of Systems  Constitutional: Negative for chills and fever.  HENT: Negative for congestion and rhinorrhea.   Eyes: Negative for visual disturbance.  Respiratory: Negative for chest tightness, shortness of breath and wheezing.   Cardiovascular: Negative for chest pain.  Gastrointestinal: Negative for abdominal pain, nausea and vomiting.  Genitourinary: Negative for dysuria.  Musculoskeletal: Negative for myalgias.  Skin: Negative for rash.  Neurological: Positive for dizziness and  light-headedness. Negative for syncope, facial asymmetry, speech difficulty, weakness and headaches.     Physical Exam Updated Vital Signs BP 105/72   Pulse 74   Temp 98.2 F (36.8 C) (Oral)   Resp 19   Ht 5' 3.5" (1.613 m)   Wt 59 kg (130 lb)   SpO2 97%   BMI 22.67 kg/m   Physical Exam  Constitutional: She appears well-developed and well-nourished. No distress.  HENT:  Head: Normocephalic and atraumatic.  Mouth/Throat: Oropharynx is clear and moist.  Eyes: Conjunctivae and EOM are normal. Pupils are equal, round, and reactive to light.  Neck: Normal range of motion. Neck supple.  Cardiovascular: Normal rate, regular rhythm, S1 normal and S2 normal.  No murmur heard. Pulmonary/Chest: Effort normal. She has no wheezes. She has no rales.  Abdominal: Soft. She exhibits no distension. There is no tenderness. There is no guarding.  Musculoskeletal: Normal range of motion. She exhibits no edema or deformity.  Lymphadenopathy:    She has no cervical adenopathy.  Neurological: She is alert.  Mental Status:  Alert, oriented, thought content appropriate, able to give a coherent history. Speech fluent without evidence of aphasia. Able to follow 2 step commands without difficulty.  Cranial Nerves:  II:  Peripheral visual fields grossly normal, pupils equal, round, reactive to light III,IV, VI: ptosis not present, extra-ocular motions intact bilaterally  V,VII: smile symmetric, facial light touch sensation equal VIII: hearing grossly normal to voice  X: uvula elevates symmetrically  XI: bilateral shoulder shrug symmetric and strong XII: midline tongue extension without fassiculations Motor:  Normal tone. 5/5 in upper and lower extremities bilaterally including strong and equal grip strength and dorsiflexion/plantar flexion Sensory: Pinprick and light touch normal in all extremities.  Deep Tendon Reflexes: 2+ and symmetric in the biceps and patella. No clonus. Cerebellar: normal  finger-to-nose with bilateral upper extremities Gait: normal gait and balance Stance: No pronator drift and good coordination, strength, and position sense with tapping of bilateral arms (performed in sitting position). CV: distal pulses palpable throughout   Skin: Skin is warm and dry. No rash noted. No erythema.  Psychiatric: She has a normal mood and affect. Her behavior is normal. Judgment and thought content normal.  Nursing note and vitals reviewed.    ED Treatments / Results  Labs (all labs ordered are listed, but only abnormal results are displayed) Labs Reviewed  BASIC METABOLIC PANEL - Abnormal; Notable for the following components:      Result Value   Glucose, Bld 104 (*)    All other components within normal limits  URINALYSIS, ROUTINE W REFLEX MICROSCOPIC - Abnormal; Notable for the following components:   APPearance HAZY (*)    Hgb urine dipstick MODERATE (*)    Leukocytes, UA LARGE (*)    Bacteria, UA FEW (*)    Squamous Epithelial / LPF 0-5 (*)    All other components within normal limits  CBC  I-STAT TROPONIN, ED  EKG  EKG Interpretation  Date/Time:  Thursday January 15 2018 15:23:26 EST Ventricular Rate:  77 PR Interval:  100 QRS Duration: 88 QT Interval:  414 QTC Calculation: 468 R Axis:   82 Text Interpretation:  Sinus rhythm with short PR Prolonged QT Abnormal ECG Normal sinus rhythm Confirmed by Thomasene Lot, [place] [phone]) on 01/15/2018 7:16:55 PM       Radiology No results found.  Procedures Procedures (including critical care time)  Medications Ordered in ED Medications  sodium chloride 0.9 % bolus 1,000 mL (1,000 mLs Intravenous New Bag/Given 01/15/18 1903)     Initial Impression / Assessment and Plan / ED Course  I have reviewed the triage vital signs and the nursing notes.  Pertinent labs & imaging results that were available during my care of the patient were reviewed by me and considered in my medical decision making (see chart for  details).    Patient is nontoxic-appearing and in no acute distress.  Patient has had no further episodes of syncope or presyncope, chest pain, or shortness of breath.  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  Will obtain troponin and reassess plan.  Patient receives point per [facility] rule for age >22 for PE. No tachycardia, chest pain, tachypnea, or shortness of breath on evaluation. Patient does exhibit orthostasis.  Patient has a normal EKG.  Troponin is elevated to 0.11.  Patient has no active chest pain, or other symptoms at this time.  Due to the elevated troponin, cannot rule out the patient had a cardiac dysrhythmia such as V. fib or V. tach.  We will admit patient for rule out of cardiac dysrhythmia.  Patient is amenable to this plan.  8:42 PM Case discussed with Dr. Blaine Hamper of [facility].  Given that patient has elevated troponin and elevated d-dimer, proceed with CTPA ordered.  Patient to be admitted.  CTPA pending.   Final Clinical Impressions(s) / ED Diagnoses   Final diagnoses:  Lightheadedness  Elevated troponin    ED Discharge Orders    None       Albesa Seen, PA-C 01/16/18 0157    Albesa Seen, PA-C 01/16/18 0158    Macarthur Critchley, MD 01/21/18 [phone]

## 2018-01-15 NOTE — ED Triage Notes (Signed)
Pt states that she was at work when she developed lightheadedness and feeling as if she was going to pass out. The episode lasted for approx 20 minutes. Pt ambulatory in triage with no current complaints at this time.

## 2018-01-16 ENCOUNTER — Other Ambulatory Visit: Payer: Self-pay

## 2018-01-16 ENCOUNTER — Observation Stay (HOSPITAL_BASED_OUTPATIENT_CLINIC_OR_DEPARTMENT_OTHER): Payer: Self-pay

## 2018-01-16 DIAGNOSIS — R55 Syncope and collapse: Secondary | ICD-10-CM

## 2018-01-16 DIAGNOSIS — R918 Other nonspecific abnormal finding of lung field: Secondary | ICD-10-CM

## 2018-01-16 DIAGNOSIS — I360 Nonrheumatic tricuspid (valve) stenosis: Secondary | ICD-10-CM

## 2018-01-16 LAB — RAPID URINE DRUG SCREEN, HOSP PERFORMED
Amphetamines: NOT DETECTED
BARBITURATES: NOT DETECTED
Benzodiazepines: NOT DETECTED
COCAINE: NOT DETECTED
Opiates: NOT DETECTED
Tetrahydrocannabinol: NOT DETECTED

## 2018-01-16 LAB — HEPATIC FUNCTION PANEL
ALT: 8 U/L — ABNORMAL LOW (ref 14–54)
AST: 15 U/L (ref 15–41)
Albumin: 3.5 g/dL (ref 3.5–5.0)
Alkaline Phosphatase: 65 U/L (ref 38–126)
BILIRUBIN TOTAL: 0.4 mg/dL (ref 0.3–1.2)
Total Protein: 6.2 g/dL — ABNORMAL LOW (ref 6.5–8.1)

## 2018-01-16 LAB — TROPONIN I
TROPONIN I: 0.1 ng/mL — AB (ref <0.03)
TROPONIN I: 0.08 ng/mL — AB (ref <0.03)
Troponin I: 0.08 ng/mL (ref <0.03)

## 2018-01-16 LAB — LIPID PANEL
CHOL/HDL RATIO: 5 ratio
Cholesterol: 218 mg/dL — ABNORMAL HIGH (ref 0–200)
HDL: 44 mg/dL (ref >40)
LDL Cholesterol: 147 mg/dL — ABNORMAL HIGH (ref 0–99)
Triglycerides: 135 mg/dL (ref <150)
VLDL: 27 mg/dL (ref 0–40)

## 2018-01-16 LAB — ECHOCARDIOGRAM COMPLETE
Height: 63.5 in
Weight: 1966.4 oz

## 2018-01-16 LAB — TSH: TSH: 4.68 u[IU]/mL — ABNORMAL HIGH (ref 0.350–4.500)

## 2018-01-16 LAB — HIV ANTIBODY (ROUTINE TESTING W REFLEX): HIV SCREEN 4TH GENERATION: NONREACTIVE

## 2018-01-16 NOTE — Progress Notes (Signed)
PROGRESS NOTE    LENAH MESSENGER   AST:[id number]  DOB: December 26, 1955  DOA: 01/15/2018 PCP: Leonard Downing, MD   Brief Narrative:  Carolyn Johns is a 62 y.o. female with medical history significant of tobacco abuse, elevated liver enzymes and hypothyroidism, who presents with near syncope.  She is found to have postive orthostatic vitals and is admitted.    Subjective: She has ambulated in the hall today and no longer feels like she is going to pass out. No other complaints today and asking to go home. When discussing her positive UA, she admitted to increased frequency of micturition and she thought she was developing a hyperactive bladder.  ROS: no complaints of nausea, vomiting, constipation diarrhea, cough, dyspnea or dysuria. No other complaints.   Assessment & Plan:   Principal Problem:   Near syncope - related to orthostatic hypotension and resolved with IVF- advised to maintain hydration with oral liquids  Active Problems: Lung nodules- nicotine abuse - discussed with the patient and explained that she needs a CT scan in 3 months and needs to work on quitting smoking- she agrees to try nicotine patches and I will prescribe them for her on discharge    Elevated troponin - cardiology recommending a stress test tomorrow   Positive D dimer - negative CTA and negative LE venous duplex   Hyperlipidemia - LDL 147 and cholesterol 218- Lipitor started- patient advised to control amount of cholesterol in diet, stop smoking and start an exercise plan    Hypothyroidism - Mildly elevated TSH- cont Synthroid for now    UTI (urinary tract infection) - cont Ceftriaxone   DVT prophylaxis: Lovenox Code Status: Full code Family Communication:  Disposition Plan: cont to follow on telemetry Consultants:   cardiology Procedures:   2 D ECHO Study Conclusions  - Left ventricle: The cavity size was normal. Wall thickness was   normal. Systolic function was normal. The  estimated ejection   fraction was in the range of 55% to 60%. Wall motion was normal;   there were no regional wall motion abnormalities. Doppler   parameters are consistent with abnormal left ventricular   relaxation (grade 1 diastolic dysfunction). The E/e&' ratio is   between 8-15, suggesting indeterminate LV filling pressure. - Mitral valve: Mildly thickened leaflets . There was mild   regurgitation. - Left atrium: The atrium was normal in size. - Inferior vena cava: The vessel was normal in size. The   respirophasic diameter changes were in the normal range (>= 50%),   consistent with normal central venous pressure.  Impressions:  - LVEF 55-60%, normal wall thickness, normal wall motion, grade 1   DD, indeterminate LV filling pressure, mild MR, normal LA size,   normal IVC.   Venous duplex lower extremity  Antimicrobials:  Anti-infectives (From admission, onward)   Start     Dose/Rate Route Frequency Ordered Stop   01/15/18 2100  cefTRIAXone (ROCEPHIN) 1 g in sodium chloride 0.9 % 100 mL IVPB     1 g 200 mL/hr over 30 Minutes Intravenous Every 24 hours 01/15/18 2045         Objective: Vitals:   01/15/18 2100 01/15/18 2149 01/16/18 0440 01/16/18 1224  BP: 125/85 122/62 133/73 133/69  Pulse: 78 71 72 69  Resp: (!) 21 20  18   Temp:  98.7 F (37.1 C) 97.9 F (36.6 C) 98.3 F (36.8 C)  TempSrc:  Oral Oral Oral  SpO2: 97% 96% 97%   Weight:  55.8  kg (123 lb 1.6 oz) 55.7 kg (122 lb 14.4 oz)   Height:  5' 3.5" (1.613 m)      Intake/Output Summary (Last 24 hours) at 01/16/2018 1639 Last data filed at 01/16/2018 1500 Gross per 24 hour  Intake 3357.5 ml  Output 850 ml  Net 2507.5 ml   Filed Weights   01/15/18 1517 01/15/18 2149 01/16/18 0440  Weight: 59 kg (130 lb) 55.8 kg (123 lb 1.6 oz) 55.7 kg (122 lb 14.4 oz)    Examination: General exam: Appears comfortable  HEENT: PERRLA, oral mucosa moist, no sclera icterus or thrush Respiratory system: Clear to  auscultation. Respiratory effort normal. Cardiovascular system: S1 & S2 heard, RRR.  No murmurs  Gastrointestinal system: Abdomen soft, non-tender, nondistended. Normal bowel sound. No organomegaly Central nervous system: Alert and oriented. No focal neurological deficits. Extremities: No cyanosis, clubbing or edema Skin: No rashes or ulcers Psychiatry:  Mood & affect appropriate.     Data Reviewed: I have personally reviewed following labs and imaging studies  CBC: Recent Labs  Lab 01/15/18 1517  WBC 6.4  HGB 12.9  HCT 39.6  MCV 91.5  PLT 867   Basic Metabolic Panel: Recent Labs  Lab 01/15/18 1517  NA 139  K 4.0  CL 105  CO2 24  GLUCOSE 104*  BUN 9  CREATININE 0.73  CALCIUM 9.1   GFR: Estimated Creatinine Clearance: 64.1 mL/min (by C-G formula based on SCr of 0.73 mg/dL). Liver Function Tests: Recent Labs  Lab 01/15/18 2347  AST 15  ALT 8*  ALKPHOS 65  BILITOT 0.4  PROT 6.2*  ALBUMIN 3.5   No results for input(s): LIPASE, AMYLASE in the last 168 hours. No results for input(s): AMMONIA in the last 168 hours. Coagulation Profile: No results for input(s): INR, PROTIME in the last 168 hours. Cardiac Enzymes: Recent Labs  Lab 01/16/18 0404 01/16/18 1205  TROPONINI 0.10* 0.08*   BNP (last 3 results) No results for input(s): PROBNP in the last 8760 hours. HbA1C: No results for input(s): HGBA1C in the last 72 hours. CBG: No results for input(s): GLUCAP in the last 168 hours. Lipid Profile: Recent Labs    01/16/18 0404  CHOL 218*  HDL 44  LDLCALC 147*  TRIG 135  CHOLHDL 5.0   Thyroid Function Tests: Recent Labs    01/16/18 0404  TSH 4.680*   Anemia Panel: No results for input(s): VITAMINB12, FOLATE, FERRITIN, TIBC, IRON, RETICCTPCT in the last 72 hours. Urine analysis:    Component Value Date/Time   COLORURINE YELLOW 01/15/2018 1726   APPEARANCEUR HAZY (A) 01/15/2018 1726   LABSPEC 1.009 01/15/2018 1726   PHURINE 5.0 01/15/2018 1726    GLUCOSEU NEGATIVE 01/15/2018 1726   HGBUR MODERATE (A) 01/15/2018 1726   BILIRUBINUR NEGATIVE 01/15/2018 1726   KETONESUR NEGATIVE 01/15/2018 1726   PROTEINUR NEGATIVE 01/15/2018 1726   UROBILINOGEN 0.2 08/16/2015 0854   NITRITE NEGATIVE 01/15/2018 1726   LEUKOCYTESUR LARGE (A) 01/15/2018 1726   Sepsis Labs: @LABRCNTIP (procalcitonin:4,lacticidven:4) )No results found for this or any previous visit (from the past 240 hour(s)).       Radiology Studies: Ct Head Wo Contrast  Result Date: 01/15/2018 CLINICAL DATA:  Syncopal episode. EXAM: CT HEAD WITHOUT CONTRAST TECHNIQUE: Contiguous axial images were obtained from the base of the skull through the vertex without intravenous contrast. COMPARISON:  None. FINDINGS: Brain: Ventricles and sulci are appropriate for patient's age. No evidence for acute cortically based infarct, intracranial hemorrhage, mass lesion or mass-effect. Vascular: Unremarkable. Skull:  Intact. Sinuses/Orbits: Paranasal sinuses are well aerated. Mastoid air cells are unremarkable. Orbits are unremarkable. Other: None. IMPRESSION: No acute intracranial process. Electronically Signed   By: Lovey Newcomer M.D.   On: 01/15/2018 21:20   Ct Angio Chest Pe W/cm &/or Wo Cm  Result Date: 01/15/2018 CLINICAL DATA:  Syncopal episode.  Elevated D-dimer. EXAM: CT ANGIOGRAPHY CHEST WITH CONTRAST TECHNIQUE: Multidetector CT imaging of the chest was performed using the standard protocol during bolus administration of intravenous contrast. Multiplanar CT image reconstructions and MIPs were obtained to evaluate the vascular anatomy. CONTRAST:  55 cc Isovue 370 COMPARISON:  Chest radiograph 03/07/2014. FINDINGS: Cardiovascular: Normal heart size. No pericardial effusion. Thoracic aortic vascular calcifications. Adequate opacification of the pulmonary arterial system. No filling defect to suggest acute pulmonary embolus. Mediastinum/Nodes: No enlarged axillary, mediastinal or hilar lymphadenopathy.  Normal esophagus. Lungs/Pleura: Central airways are patent. Dependent atelectasis within the bilateral lower lobes. Multiple bilateral pulmonary nodules. A few reference nodules are as follows including a 5 mm left upper lobe nodule (image 39; series 7), a 5 mm left upper lobe nodule (image 28; series 7) an 8 mm right upper lobe nodule (image 30; series 7), a 6 mm right upper lobe nodule (image 45; series 7) a 4 mm right lower lobe nodule (image 73; series 7) and a 4 mm left lower lobe nodule (image 61; series 7). No pleural effusion or pneumothorax. Emphysematous change. Upper Abdomen: No acute process. Musculoskeletal: No aggressive or acute appearing osseous lesions. Thoracic spine degenerative changes. Review of the MIP images confirms the above findings. IMPRESSION: 1. No evidence for acute pulmonary embolus. 2. Multiple bilateral pulmonary nodules, largest measures up to 8 mm. Non-contrast chest CT at 3 months is recommended. If the nodules are stable at time of repeat CT, then future CT at 18-24 months (from today's scan) is considered optional for low-risk patients, but is recommended for high-risk patients. This recommendation follows the consensus statement: Guidelines for Management of Incidental Pulmonary Nodules Detected on CT Images: From the Fleischner Society 2017; Radiology 2017; 284:228-243. 3. Aortic Atherosclerosis (ICD10-I70.0) and Emphysema (ICD10-J43.9). Electronically Signed   By: Lovey Newcomer M.D.   On: 01/15/2018 21:28      Scheduled Meds: . aspirin  325 mg Oral Daily  . atorvastatin  40 mg Oral q1800  . enoxaparin (LOVENOX) injection  40 mg Subcutaneous Q24H  . levothyroxine  100 mcg Oral QAC breakfast  . nicotine  21 mg Transdermal Daily   Continuous Infusions: . sodium chloride 125 mL/hr at 01/15/18 2330  . cefTRIAXone (ROCEPHIN)  IV Stopped (01/16/18 0153)     LOS: 0 days    Time spent in minutes: 35    Debbe Odea, MD [facility] Pager: [email or web address] Password [facility] 01/16/2018, 4:39 PM

## 2018-01-16 NOTE — Progress Notes (Signed)
  Echocardiogram 2D Echocardiogram has been performed.  Carolyn Johns 01/16/2018, 11:31 AM

## 2018-01-16 NOTE — Plan of Care (Signed)
  Education: Knowledge of General Education information will improve 01/16/2018 0300 - Progressing by Tristan Schroeder, RN   Health Behavior/Discharge Planning: Ability to manage health-related needs will improve 01/16/2018 0300 - Progressing by Tristan Schroeder, RN   Clinical Measurements: Diagnostic test results will improve 01/16/2018 0300 - Progressing by Tristan Schroeder, RN

## 2018-01-16 NOTE — Progress Notes (Signed)
Troponin 0.1, MD notified

## 2018-01-16 NOTE — Plan of Care (Signed)
  Education: Knowledge of General Education information will improve 01/16/2018 1533 - Progressing by Abner Greenspan, RN   Clinical Measurements: Will remain free from infection 01/16/2018 1533 - Progressing by Abner Greenspan, RN

## 2018-01-16 NOTE — Progress Notes (Signed)
Patient refused bed alarm. Will continue to monitor patient. 

## 2018-01-16 NOTE — Evaluation (Addendum)
Physical Therapy Evaluation & Discharge Patient Details Name: Carolyn Johns MRN: [id number] DOB: 1956-02-22 Today's Date: 01/16/2018   History of Present Illness  Pt is a 62 y.o. female admitted 01/15/18 after experiencing near-syncope at work; found to have positive orthostatic hypotension and UTI. CT head negative. CT angiogram negative for PE. PMH includes tobacco abuse.     Clinical Impression  Patient evaluated by Physical Therapy with no further acute PT needs identified. Pt currently indep with all functional mobility. See below for orthostatic BP values; pt asymptomatic throughout session. All education has been completed and the patient has no further questions.PT is signing off. Thank you for this referral.  Supine BP 143/69 Sitting BP 132/56 Standing BP 118/70 Standing 3 min BP 125/63    Follow Up Recommendations No PT follow up    Equipment Recommendations  None recommended by PT    Recommendations for Other Services       Precautions / Restrictions Precautions Precautions: None Restrictions Weight Bearing Restrictions: No      Mobility  Bed Mobility Overal bed mobility: Independent                Transfers Overall transfer level: Independent Equipment used: None                Ambulation/Gait Ambulation/Gait assistance: Independent Ambulation Distance (Feet): 350 Feet Assistive device: None Gait Pattern/deviations: Step-through pattern;Decreased stride length        Stairs            Wheelchair Mobility    Modified Rankin (Stroke Patients Only)       Balance Overall balance assessment: No apparent balance deficits (not formally assessed)                                           Pertinent Vitals/Pain Pain Assessment: No/denies pain    Home Living Family/patient expects to be discharged to:: Private residence Living Arrangements: Alone Available Help at Discharge: Family;Friend(s);Available  PRN/intermittently Type of Home: House Home Access: Stairs to enter   [employer] of Steps: 2 Home Layout: One level Home Equipment: None      Prior Function Level of Independence: Independent         Comments: Works as Educational psychologist. Drives     Hand Dominance        Extremity/Trunk Assessment   Upper Extremity Assessment Upper Extremity Assessment: Overall WFL for tasks assessed    Lower Extremity Assessment Lower Extremity Assessment: Overall WFL for tasks assessed    Cervical / Trunk Assessment Cervical / Trunk Assessment: Normal  Communication   Communication: No difficulties  Cognition Arousal/Alertness: Awake/alert Behavior During Therapy: WFL for tasks assessed/performed Overall Cognitive Status: Within Functional Limits for tasks assessed                                        General Comments      Exercises     Assessment/Plan    PT Assessment Patent does not need any further PT services  PT Problem List         PT Treatment Interventions      PT Goals (Current goals can be found in the Care Plan section)  Acute Rehab PT Goals PT Goal Formulation: All assessment and education complete, DC therapy  Frequency     Barriers to discharge        Co-evaluation               AM-PAC PT "6 Clicks" Daily Activity  Outcome Measure Difficulty turning over in bed (including adjusting bedclothes, sheets and blankets)?: None Difficulty moving from lying on back to sitting on the side of the bed? : None Difficulty sitting down on and standing up from a chair with arms (e.g., wheelchair, bedside commode, etc,.)?: None Help needed moving to and from a bed to chair (including a wheelchair)?: None Help needed walking in hospital room?: None Help needed climbing 3-5 steps with a railing? : None 6 Click Score: 24    End of Session   Activity Tolerance: Patient tolerated treatment well Patient left: in bed;with call  bell/phone within reach Nurse Communication: Mobility status PT Visit Diagnosis: Other abnormalities of gait and mobility (R26.89)    Time: 7615-1834 PT Time Calculation (min) (ACUTE ONLY): 18 min   Charges:   PT Evaluation $PT Eval Low Complexity: 1 Low     PT G Codes:       Mabeline Caras, PT, DPT Acute Rehab Services  Pager: [place] 01/16/2018, 8:51 AM

## 2018-01-16 NOTE — Consult Note (Addendum)
Cardiology Consultation:   Patient ID: Carolyn Johns; [id number]; [birth year]   Admit date: 01/15/2018 Date of Consult: 01/16/2018  Primary Care Provider: Leonard Downing, MD Primary Cardiologist: new - Dr. Claiborne Billings Primary Electrophysiologist:     Patient Profile:   Carolyn Johns is a 62 y.o. female with a hx of hypothyroidism, elevated liver enzymes, and current tobacco use who is being seen today for the evaluation of elevated troponin at the request of Dr. Wynelle Cleveland.  History of Present Illness:   Carolyn Johns does not currently follow with cardiology. Yesterday while at work, she had a near-syncopal event with dizziness and lightheadedness with some room spinning sensations. She became dizzy and had to sit down. She denies syncope/LOC.  This episode lasted less than 30 min. She presented to [facility] for further evaluation. She had a UA with UTI and started on ABX. She also had orthostatic hypotension and tachycardia in the ED. Troponin was found to be positive at 0.13 with an EKG without signs of acute ischemia. CTA chest was negative for PE, but showed multiple bilateral pulmonary nodules. Aortic atherosclerosis was noted on the CT chest. She was admitted and troponin trended. Cardiology consulted.  On my interview, she states that she went to work after drinking her usual amount of fluids (she does not eat breakfast or lunch) at about 0900. 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 Her manager helped her sit down. She did not lose consciousness. She was brought to [facility] for further evaluation. She denies chest pain, palpitations, shortness of breath (aside from her intermittent SOB after smoking a cigarette), orthopnea, lower extremity edema, and syncope. Occasionally, she feels like there is a "catch" in her breathing, but she denies palpitations. This happens about once per week. She has gotten dizzy at  work while walking three times prior over the past 2-3 years. We discussed hydration with water. She states that both of her parents had "clogged arteries." Question family history of heart disease.  Fasting lipid panel with elevated LDL to 147. Discussed starting a statin medication. TSH elevated to 4.68, on synthroid. Will defer to her PCP.    Past Medical History:  Diagnosis Date  . Elevated liver enzymes   . Thyroid disease   . Tobacco abuse     Past Surgical History:  Procedure Laterality Date  . CYST REMOVAL HAND       Home Medications:  Prior to Admission medications   Medication Sig Start Date End Date Taking? Authorizing Provider  levothyroxine (SYNTHROID, LEVOTHROID) 100 MCG tablet Take 100 mcg by mouth daily. 12/24/17  Yes [provider]  Melatonin 5 MG TABS Take 10 mg by mouth at bedtime as needed (sleep).   Yes [provider]  albuterol (PROVENTIL HFA;VENTOLIN HFA) 108 (90 BASE) MCG/ACT inhaler Inhale 1-2 puffs into the lungs every 6 (six) hours as needed for wheezing. Patient not taking: Reported on 01/15/2018 11/15/12   Rosana Hoes, MD  ibuprofen (ADVIL,MOTRIN) 600 MG tablet Take 1 tablet (600 mg total) by mouth every 6 (six) hours as needed for pain. Patient not taking: Reported on 01/15/2018 11/15/12   Rosana Hoes, MD  methocarbamol (ROBAXIN) 500 MG tablet Take 1 tablet (500 mg total) by mouth 2 (two) times daily. Patient not taking: Reported on 01/15/2018 08/16/15   Jeannett Senior, PA-C  predniSONE (DELTASONE) 10 MG tablet Take 5 tab day 1, take 4 tab day  2, take 3 tab day 3, take 2 tab day 4, and take 1 tab day 5 Patient not taking: Reported on 01/15/2018 08/16/15   Jeannett Senior, PA-C  traMADol (ULTRAM) 50 MG tablet Take 1 tablet (50 mg total) by mouth every 6 (six) hours as needed. Patient not taking: Reported on 01/15/2018 03/07/14   Domenic Moras, PA-C  traMADol (ULTRAM) 50 MG tablet Take 1 tablet (50 mg total) by mouth every 6 (six) hours as  needed. Patient not taking: Reported on 01/15/2018 08/16/15   Jeannett Senior, PA-C    Inpatient Medications: Scheduled Meds: . aspirin  325 mg Oral Daily  . atorvastatin  40 mg Oral q1800  . enoxaparin (LOVENOX) injection  40 mg Subcutaneous Q24H  . levothyroxine  100 mcg Oral QAC breakfast  . nicotine  21 mg Transdermal Daily   Continuous Infusions: . sodium chloride 125 mL/hr at 01/15/18 2330  . cefTRIAXone (ROCEPHIN)  IV Stopped (01/16/18 0153)   PRN Meds: ALPRAZolam, ibuprofen, Melatonin, morphine injection, nitroGLYCERIN, ondansetron (ZOFRAN) IV  Allergies:    Allergies  Allergen Reactions  . Codeine     GI upset    Social History:   Social History   Socioeconomic History  . Marital status: Legally Separated    Spouse name: Not on file  . Number of children: Not on file  . Years of education: Not on file  . Highest education level: Not on file  Social Needs  . Financial resource strain: Not on file  . Food insecurity - worry: Not on file  . Food insecurity - inability: Not on file  . Transportation needs - medical: Not on file  . Transportation needs - non-medical: Not on file  Occupational History  . Not on file  Tobacco Use  . Smoking status: Current Every Day Smoker    Packs/day: 1.00    Types: Cigarettes  Substance and Sexual Activity  . Alcohol use: No  . Drug use: No  . Sexual activity: Not on file  Other Topics Concern  . Not on file  Social History Narrative  . Not on file    Family History:   Family History  Problem Relation Age of Onset  . Stroke Father   . Seizures Father      ROS:  Please see the history of present illness.  All other ROS reviewed and negative.     Physical Exam/Data:   Vitals:   01/15/18 2030 01/15/18 2100 01/15/18 2149 01/16/18 0440  BP: 130/82 125/85 122/62 133/73  Pulse: 70 78 71 72  Resp: 18 (!) 21 20   Temp:   98.7 F (37.1 C) 97.9 F (36.6 C)  TempSrc:   Oral Oral  SpO2: 96% 97% 96% 97%  Weight:    123 lb 1.6 oz (55.8 kg) 122 lb 14.4 oz (55.7 kg)  Height:   5' 3.5" (1.613 m)     Intake/Output Summary (Last 24 hours) at 01/16/2018 1102 Last data filed at 01/16/2018 0610 Gross per 24 hour  Intake 2247.08 ml  Output 850 ml  Net 1397.08 ml   Filed Weights   01/15/18 1517 01/15/18 2149 01/16/18 0440  Weight: 130 lb (59 kg) 123 lb 1.6 oz (55.8 kg) 122 lb 14.4 oz (55.7 kg)   Body mass index is 21.43 kg/m.  General:  Well nourished, well developed, in no acute distress HEENT: normal Neck: no JVD Vascular: No carotid bruits  Cardiac:  normal S1, S2; RRR; no murmur  Lungs:  clear to  auscultation bilaterally, no wheezing, respirations unlabored Abd: soft, nontender, no hepatomegaly  Ext: no edema Musculoskeletal:  No deformities, BUE and BLE strength normal and equal Skin: warm and dry  Neuro:  CNs 2-12 intact, no focal abnormalities noted Psych:  Normal affect EKG:  The EKG was personally reviewed and demonstrates:  Sinus rhythm Telemetry:  Telemetry was personally reviewed and demonstrates:  sinus  Relevant CV Studies:  Lower extremity dopplers pending   Echo 01/16/18: Study Conclusions - Left ventricle: The cavity size was normal. Wall thickness was   normal. Systolic function was normal. The estimated ejection   fraction was in the range of 55% to 60%. Wall motion was normal;   there were no regional wall motion abnormalities. Doppler   parameters are consistent with abnormal left ventricular   relaxation (grade 1 diastolic dysfunction). The E/e&' ratio is   between 8-15, suggesting indeterminate LV filling pressure. - Mitral valve: Mildly thickened leaflets . There was mild   regurgitation. - Left atrium: The atrium was normal in size. - Inferior vena cava: The vessel was normal in size. The   respirophasic diameter changes were in the normal range (>= 50%), consistent with normal central venous pressure.  Impressions: - LVEF 55-60%, normal wall thickness, normal wall  motion, grade 1   DD, indeterminate LV filling pressure, mild MR, normal LA size,   normal IVC.   Laboratory Data:  Chemistry Recent Labs  Lab 01/15/18 1517  NA 139  K 4.0  CL 105  CO2 24  GLUCOSE 104*  BUN 9  CREATININE 0.73  CALCIUM 9.1  GFRNONAA >60  GFRAA >60  ANIONGAP 10    Recent Labs  Lab 01/15/18 2347  PROT 6.2*  ALBUMIN 3.5  AST 15  ALT 8*  ALKPHOS 65  BILITOT 0.4   Hematology Recent Labs  Lab 01/15/18 1517  WBC 6.4  RBC 4.33  HGB 12.9  HCT 39.6  MCV 91.5  MCH 29.8  MCHC 32.6  RDW 13.9  PLT 237   Cardiac Enzymes Recent Labs  Lab 01/16/18 0404  TROPONINI 0.10*    Recent Labs  Lab 01/15/18 1909  TROPIPOC 0.13*    BNPNo results for input(s): BNP, PROBNP in the last 168 hours.  DDimer  Recent Labs  Lab 01/15/18 1900  DDIMER 1.31*    Radiology/Studies:  Ct Head Wo Contrast  Result Date: 01/15/2018 CLINICAL DATA:  Syncopal episode. EXAM: CT HEAD WITHOUT CONTRAST TECHNIQUE: Contiguous axial images were obtained from the base of the skull through the vertex without intravenous contrast. COMPARISON:  None. FINDINGS: Brain: Ventricles and sulci are appropriate for patient's age. No evidence for acute cortically based infarct, intracranial hemorrhage, mass lesion or mass-effect. Vascular: Unremarkable. Skull: Intact. Sinuses/Orbits: Paranasal sinuses are well aerated. Mastoid air cells are unremarkable. Orbits are unremarkable. Other: None. IMPRESSION: No acute intracranial process. Electronically Signed   By: Lovey Newcomer M.D.   On: 01/15/2018 21:20   Ct Angio Chest Pe W/cm &/or Wo Cm  Result Date: 01/15/2018 CLINICAL DATA:  Syncopal episode.  Elevated D-dimer. EXAM: CT ANGIOGRAPHY CHEST WITH CONTRAST TECHNIQUE: Multidetector CT imaging of the chest was performed using the standard protocol during bolus administration of intravenous contrast. Multiplanar CT image reconstructions and MIPs were obtained to evaluate the vascular anatomy. CONTRAST:  55  cc Isovue 370 COMPARISON:  Chest radiograph 03/07/2014. FINDINGS: Cardiovascular: Normal heart size. No pericardial effusion. Thoracic aortic vascular calcifications. Adequate opacification of the pulmonary arterial system. No filling defect to suggest acute pulmonary  embolus. Mediastinum/Nodes: No enlarged axillary, mediastinal or hilar lymphadenopathy. Normal esophagus. Lungs/Pleura: Central airways are patent. Dependent atelectasis within the bilateral lower lobes. Multiple bilateral pulmonary nodules. A few reference nodules are as follows including a 5 mm left upper lobe nodule (image 39; series 7), a 5 mm left upper lobe nodule (image 28; series 7) an 8 mm right upper lobe nodule (image 30; series 7), a 6 mm right upper lobe nodule (image 45; series 7) a 4 mm right lower lobe nodule (image 73; series 7) and a 4 mm left lower lobe nodule (image 61; series 7). No pleural effusion or pneumothorax. Emphysematous change. Upper Abdomen: No acute process. Musculoskeletal: No aggressive or acute appearing osseous lesions. Thoracic spine degenerative changes. Review of the MIP images confirms the above findings. IMPRESSION: 1. No evidence for acute pulmonary embolus. 2. Multiple bilateral pulmonary nodules, largest measures up to 8 mm. Non-contrast chest CT at 3 months is recommended. If the nodules are stable at time of repeat CT, then future CT at 18-24 months (from today's scan) is considered optional for low-risk patients, but is recommended for high-risk patients. This recommendation follows the consensus statement: Guidelines for Management of Incidental Pulmonary Nodules Detected on CT Images: From the Fleischner Society 2017; Radiology 2017; 284:228-243. 3. Aortic Atherosclerosis (ICD10-I70.0) and Emphysema (ICD10-J43.9). Electronically Signed   By: Lovey Newcomer M.D.   On: 01/15/2018 21:28    Assessment and Plan:   1. Elevated troponin Troponin 0.13 --> 0.10 EKG without signs of acute ischemia.  The  patient denies chest pain and shortness of breath. She has risk factors for ACS, including hyperlipidemia, smoking, and questionable family history. Its possible that this elevated troponin was related to her initial tachycardia, dehydration, orthostatic hypotension, and UTI. Aortic atherosclerosis noted on CT chest, will review with attending. However, she denies anginal symptoms. Her echo was normal without WMA. Given her overall clinical picture, she would benefit from an [place]. Will also need to await her results from lower extremity dopplers prior to discharge.   2. Near syncope, orthostatic hypotension CTA negative for PE. Positive D-dimer, recommend lower extremity dopplers. Discussed possible holter/event monitor if she continues to have dizzy spells at work after increasing her hydration.   3. Hyperlipidemia LDL 147. Recommend crestor 20 mg. LFTs are normal this admission. Recheck lipids and LFTs in 6 weeks.   4. UTI ABX per primary team. Pt thinks she's had this infection for several weeks.   For questions or updates, please contact [facility] Please consult [email or web address] for contact info under Cardiology/STEMI.   Signed, Ledora Bottcher, PA  01/16/2018 11:02 AM    Patient seen and examined. Agree with assessment and plan.  Ms. 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  Yesterday she developed a presyncopal event was found to have mild orthostatic hypotension and was tachycardic upon presentation.  Troponins were mildly elevated at 0.13 and her ECG showed nonspecific T-wave changes without acute STT changes.  CT of her chest was negative for pulmonary embolism but did show multiple bilateral pulmonary nodules.  She was also found to have aortic atherosclerosis.  She does not routinely exercise out of work, but keeps busy as a Educational psychologist. Currently, patient is chest pain-free.  She has a greater than 35 year history of tobacco use as  well as strong family history for premature coronary artery disease.  Blood pressure is currently stable.  She appears older than stated age.  Rhythm is regular.  Pulse in the 70s.  HEENT is unremarkable.  She did not have carotid bruits.  Her lungs reveal decreased breath sounds without wheezing.  Rhythm was regular with a faint 1/6 systolic murmur.  There was no S3 or S4 gallop.  Abdomen was soft, nontender.  Pulses were 2+.  She had negative Homans sign.  There was no significant edema.  An echo Doppler study was just completed which showed an EF of 55-60%.  There was grade 1 diastolic dysfunction.  She had a mildly thickened mitral valve with mild mitral regurgitation.  Apparently, a d-dimer was positive at 1.31.  Duplex imaging of her legs was performed, results pending.  Lipid studies are elevated with an LDL of 147.  At present, with the patient's cardiac risk factors, I have recommended a noninvasive screening for CAD.  We had initially tried to order a CT angiogram tomorrow, but this cannot be performed.  As result, we will schedule her for an exercise nuclear stress test in a.m. with probable discharge later in the afternoon.  Recommend initiation of rosuvastatin 20 mg.  Also discussed smoking cessation at length.  She will ultimately require follow-up evaluation of her pulmonary nodules.   Troy Sine, MD, [facility] 01/16/2018 3:38 PM

## 2018-01-16 NOTE — Progress Notes (Signed)
Preliminary notes by tech--Bilateral lower extremities venous exam completed. Bilateral veins compressible with flow. No evidence of DVT or superficial veins thrombosis.   Carolyn Johns (RDMS, RVT)

## 2018-01-16 NOTE — Progress Notes (Signed)
OT Cancellation Note  Patient Details Name: Carolyn Johns MRN: [id number] DOB: [birth year]   Cancelled Treatment:     Reason evaluation/treatment not complete: Occupational Therapy orders received and chart reviewed. Noted elevated Troponin level per labs this morning. Per discussion with pt, chart review & PT note review, pt is currently independent with all acitivty & denies any needs at this time. Will sign off, please re-order if any changes arise.  Carlynn Herald, Dejha King Beth Dixon, OTR/L 01/16/2018, 9:22 AM

## 2018-01-16 NOTE — Progress Notes (Addendum)
Patient admitted to floor and oriented to unit, patient updated on plan of care, safety precautions in place. Meds given, cardiac monitoring initiated and verified (box16, NSR). Call bell in reach.

## 2018-01-17 ENCOUNTER — Observation Stay (HOSPITAL_BASED_OUTPATIENT_CLINIC_OR_DEPARTMENT_OTHER): Payer: Self-pay

## 2018-01-17 ENCOUNTER — Encounter (HOSPITAL_COMMUNITY): Payer: Self-pay

## 2018-01-17 DIAGNOSIS — R42 Dizziness and giddiness: Secondary | ICD-10-CM

## 2018-01-17 DIAGNOSIS — R748 Abnormal levels of other serum enzymes: Secondary | ICD-10-CM

## 2018-01-17 LAB — URINE CULTURE: Culture: NO GROWTH

## 2018-01-17 LAB — NM MYOCAR MULTI W/SPECT W/WALL MOTION / EF
CHL CUP MPHR: 161 {beats}/min
Estimated workload: 1 METS
Exercise duration (min): 5 min
Peak HR: 111 {beats}/min
Percent HR: 68 %
Rest HR: 65 {beats}/min

## 2018-01-17 LAB — HEMOGLOBIN A1C
Hgb A1c MFr Bld: 5.8 % — ABNORMAL HIGH (ref 4.8–5.6)
MEAN PLASMA GLUCOSE: 120 mg/dL

## 2018-01-17 MED ORDER — ATORVASTATIN CALCIUM 40 MG PO TABS: 40.0000 mg | ORAL_TABLET | Freq: Every day | ORAL | 0 refills | Status: DC

## 2018-01-17 MED ORDER — REGADENOSON 0.4 MG/5ML IV SOLN
0.4000 mg | Freq: Once | INTRAVENOUS | Status: AC
  Administered 2018-01-17: 0.4 mg via INTRAVENOUS

## 2018-01-17 MED ORDER — TECHNETIUM TC 99M TETROFOSMIN IV KIT
10.0000 | PACK | Freq: Once | INTRAVENOUS | Status: AC | PRN
  Administered 2018-01-17: 10 via INTRAVENOUS

## 2018-01-17 MED ORDER — TECHNETIUM TC 99M TETROFOSMIN IV KIT
30.0000 | PACK | Freq: Once | INTRAVENOUS | Status: AC | PRN
  Administered 2018-01-17: 30 via INTRAVENOUS

## 2018-01-17 MED ORDER — REGADENOSON 0.4 MG/5ML IV SOLN
INTRAVENOUS | Status: AC
  Administered 2018-01-17: 0.4 mg via INTRAVENOUS
  Filled 2018-01-17: qty 5

## 2018-01-17 NOTE — Progress Notes (Signed)
Pt returns from stress test, MD at bedside- states DC IV fluids.

## 2018-01-17 NOTE — Progress Notes (Signed)
Call placed to CCMD to notify of telemetry monitoring d/c.   

## 2018-01-17 NOTE — Progress Notes (Addendum)
Progress Note  Patient Name: Carolyn Johns Date of Encounter: 01/17/2018  Primary Cardiologist: Claiborne Billings    Subjective   62 y.o. female with a hx of hypothyroidism, elevated liver enzymes, and current tobacco use who is being seen today for the evaluation of elevated troponin at the request of Dr. Wynelle Cleveland Troponin levels are mildly elevated and the trend is flat.  Level is 0.08.  No chest pain. Seen in Nuc med.  Inpatient Medications    Scheduled Meds: . aspirin  325 mg Oral Daily  . atorvastatin  40 mg Oral q1800  . enoxaparin (LOVENOX) injection  40 mg Subcutaneous Q24H  . levothyroxine  100 mcg Oral QAC breakfast  . nicotine  21 mg Transdermal Daily   Continuous Infusions: . sodium chloride 125 mL/hr at 01/17/18 0242  . cefTRIAXone (ROCEPHIN)  IV Stopped (01/16/18 2121)   PRN Meds: ALPRAZolam, ibuprofen, Melatonin, morphine injection, nitroGLYCERIN, ondansetron (ZOFRAN) IV   Vital Signs    Vitals:   01/16/18 2140 01/17/18 0537 01/17/18 0853 01/17/18 0924  BP: 123/60 94/64 (!) 143/74 135/77  Pulse: 70 66 (!) 59 99  Resp: 18 18    Temp: 97.8 F (36.6 C) 98.4 F (36.9 C)    TempSrc: Oral Oral    SpO2: 96% 96%    Weight:  126 lb 6.4 oz (57.3 kg)    Height:        Intake/Output Summary (Last 24 hours) at 01/17/2018 0925 Last data filed at 01/17/2018 0539 Gross per 24 hour  Intake 1552.42 ml  Output 800 ml  Net 752.42 ml   Filed Weights   01/15/18 2149 01/16/18 0440 01/17/18 0537  Weight: 123 lb 1.6 oz (55.8 kg) 122 lb 14.4 oz (55.7 kg) 126 lb 6.4 oz (57.3 kg)    Telemetry    SR - Personally Reviewed  ECG    SR - Personally Reviewed  Physical Exam   General: Well developed, well nourished, female appearing in no acute distress. Head: Normocephalic, atraumatic.  Neck: Supple without bruits, JVD. Lungs:  Resp regular and unlabored, CTA. Heart: RRR, S1, S2, no S3, S4, or murmur; no rub. Abdomen: Soft, non-tender, non-distended with normoactive bowel  sounds. No hepatomegaly. No rebound/guarding. No obvious abdominal masses. Extremities: No clubbing, cyanosis, edema. Distal pedal pulses are 2+ bilaterally. Neuro: Alert and oriented X 3. Moves all extremities spontaneously. Psych: Normal affect.  Labs    Chemistry Recent Labs  Lab 01/15/18 1517 01/15/18 2347  NA 139  --   K 4.0  --   CL 105  --   CO2 24  --   GLUCOSE 104*  --   BUN 9  --   CREATININE 0.73  --   CALCIUM 9.1  --   PROT  --  6.2*  ALBUMIN  --  3.5  AST  --  15  ALT  --  8*  ALKPHOS  --  65  BILITOT  --  0.4  GFRNONAA >60  --   GFRAA >60  --   ANIONGAP 10  --      Hematology Recent Labs  Lab 01/15/18 1517  WBC 6.4  RBC 4.33  HGB 12.9  HCT 39.6  MCV 91.5  MCH 29.8  MCHC 32.6  RDW 13.9  PLT 237    Cardiac Enzymes Recent Labs  Lab 01/16/18 0404 01/16/18 1205 01/16/18 1852  TROPONINI 0.10* 0.08* 0.08*    Recent Labs  Lab 01/15/18 1909  TROPIPOC 0.13*     BNPNo  results for input(s): BNP, PROBNP in the last 168 hours.   DDimer  Recent Labs  Lab 01/15/18 1900  DDIMER 1.31*      Radiology    Ct Head Wo Contrast  Result Date: 01/15/2018 CLINICAL DATA:  Syncopal episode. EXAM: CT HEAD WITHOUT CONTRAST TECHNIQUE: Contiguous axial images were obtained from the base of the skull through the vertex without intravenous contrast. COMPARISON:  None. FINDINGS: Brain: Ventricles and sulci are appropriate for patient's age. No evidence for acute cortically based infarct, intracranial hemorrhage, mass lesion or mass-effect. Vascular: Unremarkable. Skull: Intact. Sinuses/Orbits: Paranasal sinuses are well aerated. Mastoid air cells are unremarkable. Orbits are unremarkable. Other: None. IMPRESSION: No acute intracranial process. Electronically Signed   By: Lovey Newcomer M.D.   On: 01/15/2018 21:20   Ct Angio Chest Pe W/cm &/or Wo Cm  Result Date: 01/15/2018 CLINICAL DATA:  Syncopal episode.  Elevated D-dimer. EXAM: CT ANGIOGRAPHY CHEST WITH CONTRAST  TECHNIQUE: Multidetector CT imaging of the chest was performed using the standard protocol during bolus administration of intravenous contrast. Multiplanar CT image reconstructions and MIPs were obtained to evaluate the vascular anatomy. CONTRAST:  55 cc Isovue 370 COMPARISON:  Chest radiograph 03/07/2014. FINDINGS: Cardiovascular: Normal heart size. No pericardial effusion. Thoracic aortic vascular calcifications. Adequate opacification of the pulmonary arterial system. No filling defect to suggest acute pulmonary embolus. Mediastinum/Nodes: No enlarged axillary, mediastinal or hilar lymphadenopathy. Normal esophagus. Lungs/Pleura: Central airways are patent. Dependent atelectasis within the bilateral lower lobes. Multiple bilateral pulmonary nodules. A few reference nodules are as follows including a 5 mm left upper lobe nodule (image 39; series 7), a 5 mm left upper lobe nodule (image 28; series 7) an 8 mm right upper lobe nodule (image 30; series 7), a 6 mm right upper lobe nodule (image 45; series 7) a 4 mm right lower lobe nodule (image 73; series 7) and a 4 mm left lower lobe nodule (image 61; series 7). No pleural effusion or pneumothorax. Emphysematous change. Upper Abdomen: No acute process. Musculoskeletal: No aggressive or acute appearing osseous lesions. Thoracic spine degenerative changes. Review of the MIP images confirms the above findings. IMPRESSION: 1. No evidence for acute pulmonary embolus. 2. Multiple bilateral pulmonary nodules, largest measures up to 8 mm. Non-contrast chest CT at 3 months is recommended. If the nodules are stable at time of repeat CT, then future CT at 18-24 months (from today's scan) is considered optional for low-risk patients, but is recommended for high-risk patients. This recommendation follows the consensus statement: Guidelines for Management of Incidental Pulmonary Nodules Detected on CT Images: From the Fleischner Society 2017; Radiology 2017; 284:228-243. 3. Aortic  Atherosclerosis (ICD10-I70.0) and Emphysema (ICD10-J43.9). Electronically Signed   By: Lovey Newcomer M.D.   On: 01/15/2018 21:28    Cardiac Studies   TTE: 01/16/18  Study Conclusions  - Left ventricle: The cavity size was normal. Wall thickness was   normal. Systolic function was normal. The estimated ejection   fraction was in the range of 55% to 60%. Wall motion was normal;   there were no regional wall motion abnormalities. Doppler   parameters are consistent with abnormal left ventricular   relaxation (grade 1 diastolic dysfunction). The E/e&' ratio is   between 8-15, suggesting indeterminate LV filling pressure. - Mitral valve: Mildly thickened leaflets . There was mild   regurgitation. - Left atrium: The atrium was normal in size. - Inferior vena cava: The vessel was normal in size. The   respirophasic diameter changes were in the  normal range (>= 50%),   consistent with normal central venous pressure.  Impressions:  - LVEF 55-60%, normal wall thickness, normal wall motion, grade 1   DD, indeterminate LV filling pressure, mild MR, normal LA size,   normal IVC.   Patient Profile     62 y.o. female with a hx of hypothyroidism, elevated liver enzymes, and current tobacco use who presented with near syncope and positive troponin.   Assessment & Plan    1. Elevated troponin: Troponin 0.13 --> 0.10 EKG without signs of acute ischemia.  The patient denies chest pain and shortness of breath. She has risk factors for ACS, including hyperlipidemia, smoking, and questionable family history. Its possible that this elevated troponin was related to her initial tachycardia, dehydration, orthostatic hypotension, and UTI. Aortic atherosclerosis noted on CT chest, will review with attending. However, she denies anginal symptoms. Her echo was normal without WMA.  -- seen in nuc med. Stress images pending.   2. Near syncope, orthostatic hypotension CTA negative for PE. Positive D-dimer,  recommend lower extremity dopplers. Discussed possible holter/event monitor if she continues to have dizzy spells at work after increasing her hydration.  3. Hyperlipidemia LDL 147. Recommend crestor 20 mg. LFTs are normal this admission. Recheck lipids and LFTs in 6 weeks.  4. UTI ABX per primary team. Pt thinks she's had this infection for several weeks.  Signed, Reino Bellis, NP  01/17/2018, 9:25 AM  Pager # [phone]   For questions or updates, please contact [place] Please consult [email or web address] for contact info under Cardiology/STEMI.  Attending Note:   The patient was seen and examined.  Agree with assessment and plan as noted above.  Changes made to the above note as needed.  Patient seen and independently examined with Reino Bellis, NP .   We discussed all aspects of the encounter. I agree with the assessment and plan as stated above.  1  Dizziness :  She denies any chest pain.  Was found to have an elevated troponin in the ER  .  She has multiple risk factors for coronary artery disease.  Her pain is somewhat atypical.  She had an elevated d-dimer but CT angiogram of the chest was negative for pulmonary embolus.  [facility] study was negative for ischemia.  She has normal left ventricular systolic function.  In my opinion, she is stable to be able to be discharged today.  Have advised her to stop smoking.  She should follow-up with her primary medical doctor.  If she needs further cardiology assistance she will follow-up with Dr. Claiborne Billings.   I have spent a total of 40 minutes with patient reviewing hospital  notes , telemetry, EKGs, labs and examining patient as well as establishing an assessment and plan that was discussed with the patient. > 50% of time was spent in direct patient care.    Thayer Headings, Brooke Bonito., MD, [facility] 01/17/2018, 11:53 AM [address]  [place] Pager [phone]

## 2018-01-17 NOTE — Progress Notes (Signed)
Discharge paperwork reviewed with pt and family. No questions at this time. Pt denies complaints.   Pt also refuses wheelchair on discharge; insists on walking to car.

## 2018-01-17 NOTE — Plan of Care (Signed)
  Activity: Risk for activity intolerance will decrease 01/17/2018 0429 - Completed/Met by Evert Kohl, RN   Nutrition: Adequate nutrition will be maintained 01/17/2018 0429 - Completed/Met by Evert Kohl, RN   Coping: Level of anxiety will decrease 01/17/2018 0429 - Completed/Met by Evert Kohl, RN   Elimination: Will not experience complications related to bowel motility 01/17/2018 0429 - Completed/Met by Evert Kohl, RN   Pain Managment: General experience of comfort will improve 01/17/2018 0429 - Completed/Met by Evert Kohl, RN   Safety: Ability to remain free from injury will improve 01/17/2018 0429 - Completed/Met by Evert Kohl, RN   Skin Integrity: Risk for impaired skin integrity will decrease 01/17/2018 0429 - Completed/Met by Evert Kohl, RN   Activity: Risk for activity intolerance will decrease 01/17/2018 0429 - Completed/Met by Evert Kohl, RN   Nutrition: Adequate nutrition will be maintained 01/17/2018 0429 - Completed/Met by Evert Kohl, RN   Safety: Ability to remain free from injury will improve 01/17/2018 0429 - Completed/Met by Evert Kohl, RN   Skin Integrity: Risk for impaired skin integrity will decrease 01/17/2018 0429 - Completed/Met by Evert Kohl, RN

## 2018-01-17 NOTE — Progress Notes (Signed)
Pt gone to stress test at 0720, at end of shift report from night RN. No complaints.

## 2018-01-17 NOTE — Discharge Summary (Signed)
Physician Discharge Summary  Carolyn Johns NLZ:[id number] DOB: [birth year] DOA: 01/15/2018  PCP: Leonard Downing, MD  Admit date: 01/15/2018 Discharge date: 01/17/2018  Admitted From: home Disposition:  home   Recommendations for Outpatient Follow-up:  1. outpt follow up of lung nodules 2. Continue to encourage to stop smoking 3. F/u fasting lipid panel in 3-4 months Discharge Condition:  stable   CODE STATUS:  Full code   Consultations:  cardiology    Discharge Diagnoses:  Principal Problem:   Near syncope Active Problems:   Elevated troponin   Tobacco abuse   Pulmonary nodules   Hypothyroidism   HLD   Subjective: Continues to ambulate without dizziness today. No chest pain and no dyspnea. No other complaints.   Brief Summary:   Carolyn Johns is a 62 y.o.femalewith medical history significant oftobacco abuse,elevated liver enzymes and hypothyroidism, whopresents with near syncope while she was at work. Please see HPI for further details..  She is found to have postive orthostatic vitals and is admitted for IV hydration.      Hospital Course:  Principal Problem:   Near syncope - related to orthostatic hypotension and symptoms have resolved with IVF - advised to maintain hydration with oral liquids and contact PCP if it recurrs  Active Problems: Lung nodules- nicotine abuse - discussed findings with the patient and explained that she needs a CT scan in 3 months and also needs to work on quitting smoking- she agrees to try nicotine patches and try to cut back on smoking    Elevated troponin - cardiology recommending a stress test which was performed today and is a low risk study-    Positive D dimer - negative CTA and negative LE venous duplex   Hyperlipidemia - LDL 147 and cholesterol 218- statin started- patient advised to control amount of cholesterol in diet, stop smoking and start an exercise plan    Hypothyroidism - Mildly elevated TSH-  cont Synthroid for now    UTI (urinary tract infection) -with urinary frequency- treated with Ceftriaxone initially - U culture is negative and therefore will stop antibiotics      Discharge Exam: Vitals:   01/17/18 0927 01/17/18 1145  BP: 135/69 (!) 141/61  Pulse: 100 64  Resp:  18  Temp:  98.9 F (37.2 C)  SpO2:  97%   Vitals:   01/17/18 0924 01/17/18 0925 01/17/18 0927 01/17/18 1145  BP: 135/77 127/70 135/69 (!) 141/61  Pulse: 99 (!) 105 100 64  Resp:    18  Temp:    98.9 F (37.2 C)  TempSrc:    Oral  SpO2:    97%  Weight:      Height:        General: Pt is alert, awake, not in acute distress Cardiovascular: RRR, S1/S2 +, no rubs, no gallops Respiratory: CTA bilaterally, no wheezing, no rhonchi Abdominal: Soft, NT, ND, bowel sounds + Extremities: no edema, no cyanosis   Discharge Instructions  Discharge Instructions    Diet - low sodium heart healthy   Complete by:  As directed    Increase activity slowly   Complete by:  As directed      Allergies as of 01/17/2018      Reactions   Codeine    GI upset      Medication List    TAKE these medications   atorvastatin 40 MG tablet Commonly known as:  LIPITOR Take 1 tablet (40 mg total) by mouth daily at 6 PM.  levothyroxine 100 MCG tablet Commonly known as:  SYNTHROID, LEVOTHROID Take 100 mcg by mouth daily.   Melatonin 5 MG Tabs Take 10 mg by mouth at bedtime as needed (sleep).       Allergies  Allergen Reactions  . Codeine     GI upset     Procedures/Studies:   2 D ECHO   - LVEF 55-60%, normal wall thickness, normal wall motion, grade 1   DD, indeterminate LV filling pressure, mild MR, normal LA size,   normal IVC.  LE venous duplex   Ct Head Wo Contrast  Result Date: 01/15/2018 CLINICAL DATA:  Syncopal episode. EXAM: CT HEAD WITHOUT CONTRAST TECHNIQUE: Contiguous axial images were obtained from the base of the skull through the vertex without intravenous contrast.  COMPARISON:  None. FINDINGS: Brain: Ventricles and sulci are appropriate for patient's age. No evidence for acute cortically based infarct, intracranial hemorrhage, mass lesion or mass-effect. Vascular: Unremarkable. Skull: Intact. Sinuses/Orbits: Paranasal sinuses are well aerated. Mastoid air cells are unremarkable. Orbits are unremarkable. Other: None. IMPRESSION: No acute intracranial process. Electronically Signed   By: Lovey Newcomer M.D.   On: 01/15/2018 21:20   Ct Angio Chest Pe W/cm &/or Wo Cm  Result Date: 01/15/2018 CLINICAL DATA:  Syncopal episode.  Elevated D-dimer. EXAM: CT ANGIOGRAPHY CHEST WITH CONTRAST TECHNIQUE: Multidetector CT imaging of the chest was performed using the standard protocol during bolus administration of intravenous contrast. Multiplanar CT image reconstructions and MIPs were obtained to evaluate the vascular anatomy. CONTRAST:  55 cc Isovue 370 COMPARISON:  Chest radiograph 03/07/2014. FINDINGS: Cardiovascular: Normal heart size. No pericardial effusion. Thoracic aortic vascular calcifications. Adequate opacification of the pulmonary arterial system. No filling defect to suggest acute pulmonary embolus. Mediastinum/Nodes: No enlarged axillary, mediastinal or hilar lymphadenopathy. Normal esophagus. Lungs/Pleura: Central airways are patent. Dependent atelectasis within the bilateral lower lobes. Multiple bilateral pulmonary nodules. A few reference nodules are as follows including a 5 mm left upper lobe nodule (image 39; series 7), a 5 mm left upper lobe nodule (image 28; series 7) an 8 mm right upper lobe nodule (image 30; series 7), a 6 mm right upper lobe nodule (image 45; series 7) a 4 mm right lower lobe nodule (image 73; series 7) and a 4 mm left lower lobe nodule (image 61; series 7). No pleural effusion or pneumothorax. Emphysematous change. Upper Abdomen: No acute process. Musculoskeletal: No aggressive or acute appearing osseous lesions. Thoracic spine degenerative  changes. Review of the MIP images confirms the above findings. IMPRESSION: 1. No evidence for acute pulmonary embolus. 2. Multiple bilateral pulmonary nodules, largest measures up to 8 mm. Non-contrast chest CT at 3 months is recommended. If the nodules are stable at time of repeat CT, then future CT at 18-24 months (from today's scan) is considered optional for low-risk patients, but is recommended for high-risk patients. This recommendation follows the consensus statement: Guidelines for Management of Incidental Pulmonary Nodules Detected on CT Images: From the Fleischner Society 2017; Radiology 2017; 284:228-243. 3. Aortic Atherosclerosis (ICD10-I70.0) and Emphysema (ICD10-J43.9). Electronically Signed   By: Lovey Newcomer M.D.   On: 01/15/2018 21:28   Nm Myocar Multi W/spect W/wall Motion / Ef  Result Date: 01/17/2018  There was no ST segment deviation noted during stress.  No T wave inversion was noted during stress.  Nuclear stress EF: 59%. The left ventricular ejection fraction is normal (55-65%).  The study is normal. This is a low risk study.      The results of  significant diagnostics from this hospitalization (including imaging, microbiology, ancillary and laboratory) are listed below for reference.     Microbiology: Recent Results (from the past 240 hour(s))  Culture, blood (Routine X 2) w Reflex to ID Panel     Status: None (Preliminary result)   Collection Time: 01/15/18 11:40 PM  Result Value Ref Range Status   Specimen Description BLOOD LEFT ANTECUBITAL  Final   Special Requests IN PEDIATRIC BOTTLE Blood Culture adequate volume  Final   Culture   Final    NO GROWTH 1 DAY Performed at [facility], [place] [address], [place], [place] [address]    Report Status PENDING  Incomplete  Culture, blood (Routine X 2) w Reflex to ID Panel     Status: None (Preliminary result)   Collection Time: 01/15/18 11:45 PM  Result Value Ref Range Status   Specimen Description BLOOD LEFT  ANTECUBITAL  Final   Special Requests IN PEDIATRIC BOTTLE Blood Culture adequate volume  Final   Culture   Final    NO GROWTH 1 DAY Performed at [facility], [place] [address], [place], [place] [address]    Report Status PENDING  Incomplete  Urine Culture     Status: None   Collection Time: 01/15/18 11:47 PM  Result Value Ref Range Status   Specimen Description URINE, CLEAN CATCH  Final   Special Requests NONE  Final   Culture   Final    NO GROWTH Performed at [facility], [place] [address], [place], [place] [address]    Report Status 01/17/2018 FINAL  Final     Labs: BNP (last 3 results) No results for input(s): BNP in the last 8760 hours. Basic Metabolic Panel: Recent Labs  Lab 01/15/18 1517  NA 139  K 4.0  CL 105  CO2 24  GLUCOSE 104*  BUN 9  CREATININE 0.73  CALCIUM 9.1   Liver Function Tests: Recent Labs  Lab 01/15/18 2347  AST 15  ALT 8*  ALKPHOS 65  BILITOT 0.4  PROT 6.2*  ALBUMIN 3.5   No results for input(s): LIPASE, AMYLASE in the last 168 hours. No results for input(s): AMMONIA in the last 168 hours. CBC: Recent Labs  Lab 01/15/18 1517  WBC 6.4  HGB 12.9  HCT 39.6  MCV 91.5  PLT 237   Cardiac Enzymes: Recent Labs  Lab 01/16/18 0404 01/16/18 1205 01/16/18 1852  TROPONINI 0.10* 0.08* 0.08*   BNP: Invalid input(s): POCBNP CBG: No results for input(s): GLUCAP in the last 168 hours. D-Dimer Recent Labs    01/15/18 1900  DDIMER 1.31*   Hgb A1c Recent Labs    01/16/18 0404  HGBA1C 5.8*   Lipid Profile Recent Labs    01/16/18 0404  CHOL 218*  HDL 44  LDLCALC 147*  TRIG 135  CHOLHDL 5.0   Thyroid function studies Recent Labs    01/16/18 0404  TSH 4.680*   Anemia work up No results for input(s): VITAMINB12, FOLATE, FERRITIN, TIBC, IRON, RETICCTPCT in the last 72 hours. Urinalysis    Component Value Date/Time   COLORURINE YELLOW 01/15/2018 1726   APPEARANCEUR HAZY (A) 01/15/2018 1726   LABSPEC 1.009  01/15/2018 1726   PHURINE 5.0 01/15/2018 1726   GLUCOSEU NEGATIVE 01/15/2018 1726   HGBUR MODERATE (A) 01/15/2018 1726   BILIRUBINUR NEGATIVE 01/15/2018 1726   KETONESUR NEGATIVE 01/15/2018 1726   PROTEINUR NEGATIVE 01/15/2018 1726   UROBILINOGEN 0.2 08/16/2015 0854   NITRITE NEGATIVE 01/15/2018 1726  LEUKOCYTESUR LARGE (A) 01/15/2018 1726   Sepsis Labs Invalid input(s): PROCALCITONIN,  WBC,  LACTICIDVEN Microbiology Recent Results (from the past 240 hour(s))  Culture, blood (Routine X 2) w Reflex to ID Panel     Status: None (Preliminary result)   Collection Time: 01/15/18 11:40 PM  Result Value Ref Range Status   Specimen Description BLOOD LEFT ANTECUBITAL  Final   Special Requests IN PEDIATRIC BOTTLE Blood Culture adequate volume  Final   Culture   Final    NO GROWTH 1 DAY Performed at [facility], [place] [address] [place], [place] [address]    Report Status PENDING  Incomplete  Culture, blood (Routine X 2) w Reflex to ID Panel     Status: None (Preliminary result)   Collection Time: 01/15/18 11:45 PM  Result Value Ref Range Status   Specimen Description BLOOD LEFT ANTECUBITAL  Final   Special Requests IN PEDIATRIC BOTTLE Blood Culture adequate volume  Final   Culture   Final    NO GROWTH 1 DAY Performed at [facility], [place] [address], [place], [place] [address]    Report Status PENDING  Incomplete  Urine Culture     Status: None   Collection Time: 01/15/18 11:47 PM  Result Value Ref Range Status   Specimen Description URINE, CLEAN CATCH  Final   Special Requests NONE  Final   Culture   Final    NO GROWTH Performed at [facility], [place] [address] [place], [place] [address]    Report Status 01/17/2018 FINAL  Final     Time coordinating discharge: Over 30 minutes  SIGNED:   Debbe Odea, MD  [facility] 01/17/2018, 1:52 PM Pager   If 7PM-7AM, please contact night-coverage [email or web address] Password [email or web address]

## 2018-01-21 LAB — CULTURE, BLOOD (ROUTINE X 2)
CULTURE: NO GROWTH
Culture: NO GROWTH
SPECIAL REQUESTS: ADEQUATE
Special Requests: ADEQUATE

## 2018-02-24 ENCOUNTER — Other Ambulatory Visit: Payer: Self-pay | Admitting: Family Medicine

## 2018-02-24 DIAGNOSIS — R911 Solitary pulmonary nodule: Secondary | ICD-10-CM

## 2018-04-16 ENCOUNTER — Ambulatory Visit
Admission: RE | Admit: 2018-04-16 | Discharge: 2018-04-16 | Disposition: A | Discharge status: Home / Self Care | Payer: No Typology Code available for payment source | Source: Ambulatory Visit | Attending: Family Medicine | Admitting: Family Medicine

## 2018-04-16 DIAGNOSIS — R911 Solitary pulmonary nodule: Secondary | ICD-10-CM

## 2019-10-31 IMAGING — CT CT HEAD W/O CM
4 series · 17 of 47 positions shown, 19 images · non-contrast
Comparison: None.

CLINICAL DATA: Syncopal episode.

EXAM:
CT HEAD WITHOUT CONTRAST
TECHNIQUE: Contiguous axial images were obtained from the base of the skull
through the vertex without intravenous contrast.

[Series 3: head without · axial · non-contrast · 0.39mm/px · z∈[+1069,+1189]mm · 7 of 32 slices shown, 9 images]
[im 4/32  brain]
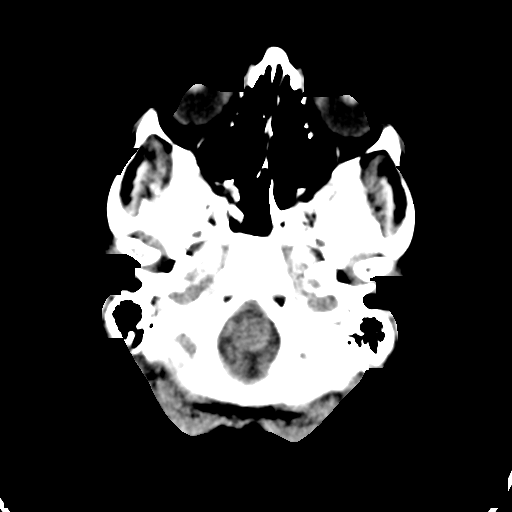
[im 4/32  bone]
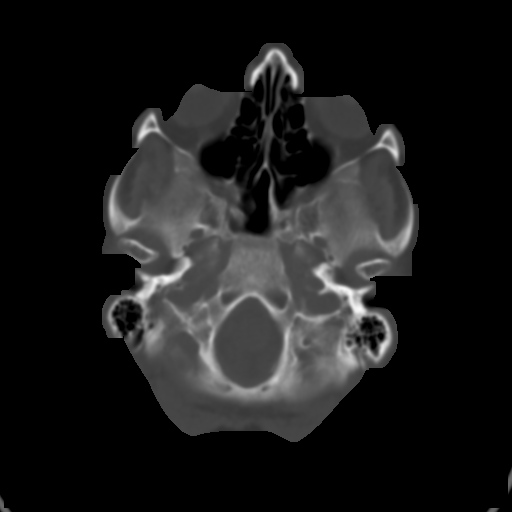
[im 8/32  brain]
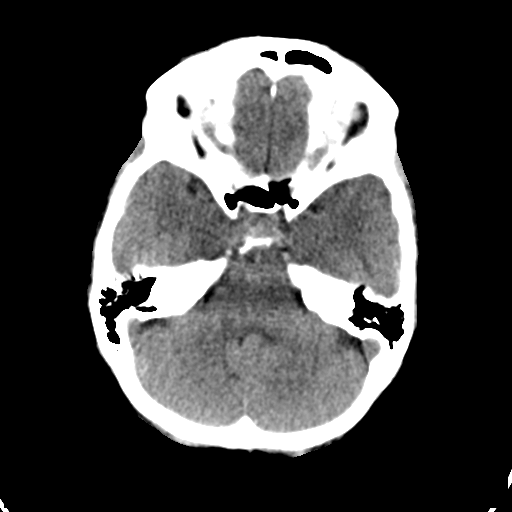
[im 12/32  brain]
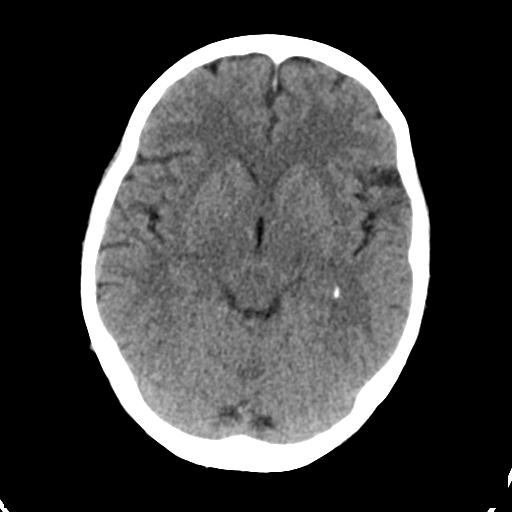
[im 16/32  brain]
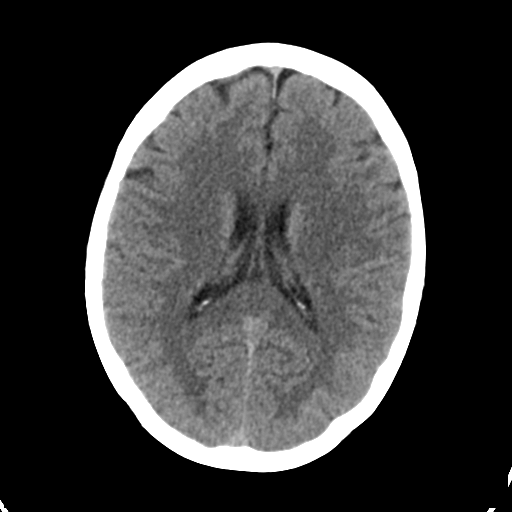
[im 20/32  brain]
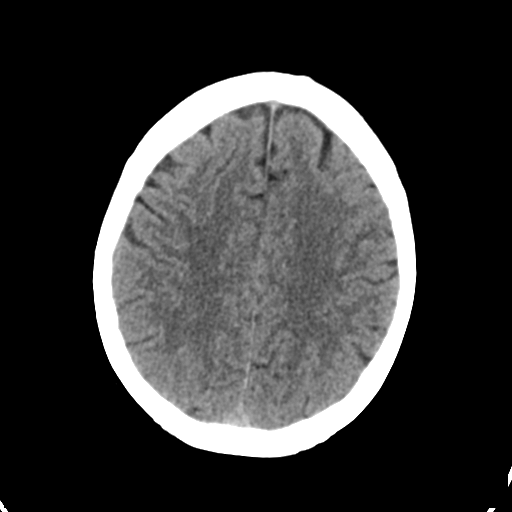
[im 20/32  bone]
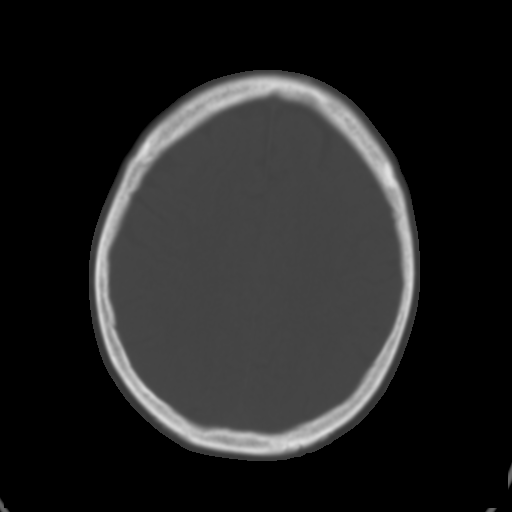
[im 24/32  brain]
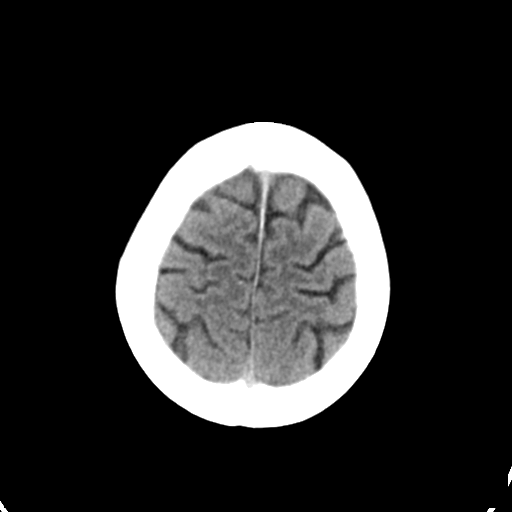
[im 28/32  brain]
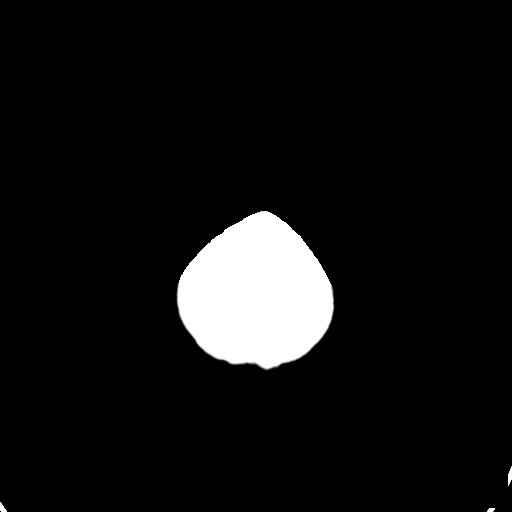

[Series 4: head bone · axial · 0.39mm/px · z∈[+1068,+1122]mm · 4 of 78 slices shown]
[im 8/78  bone]
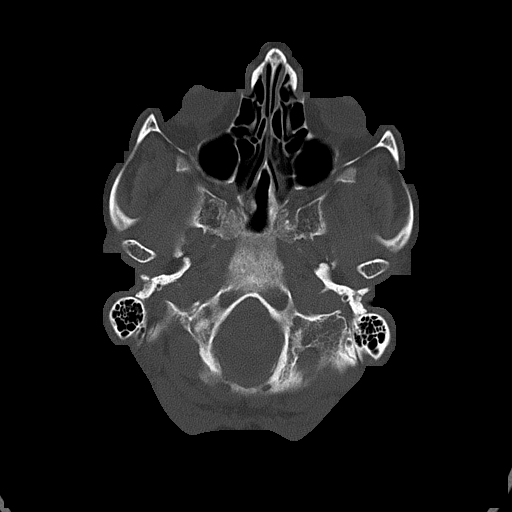
[im 16/78  bone]
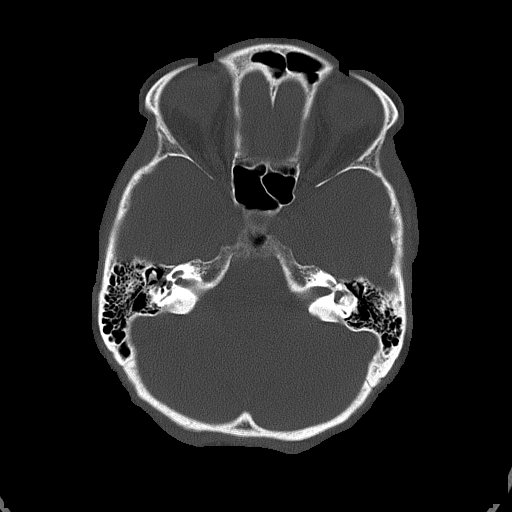
[im 24/78  bone]
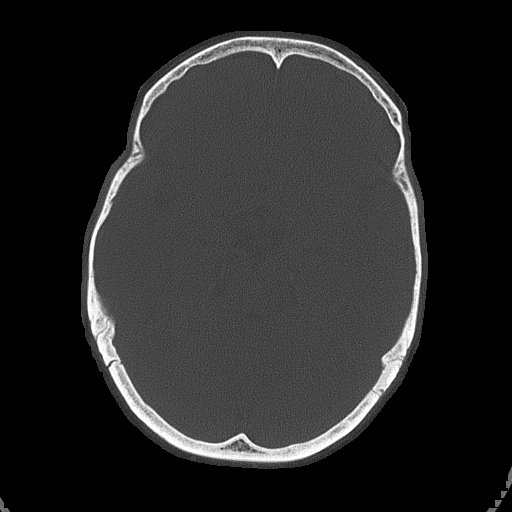
[im 35/78  bone]
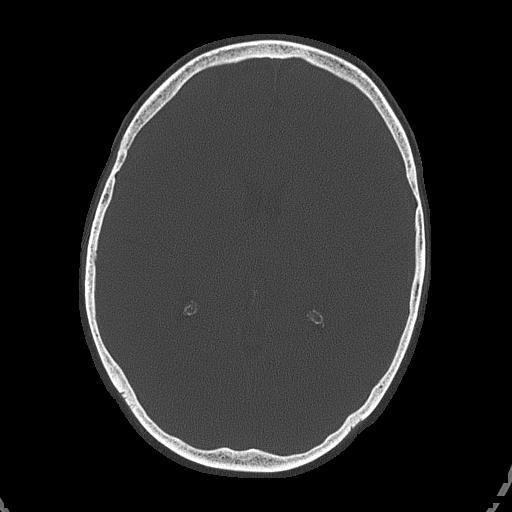

[Series 5: head without cor · coronal · non-contrast · 0.29mm/px · 3 of 63 slices shown]
[im 21/63  brain]
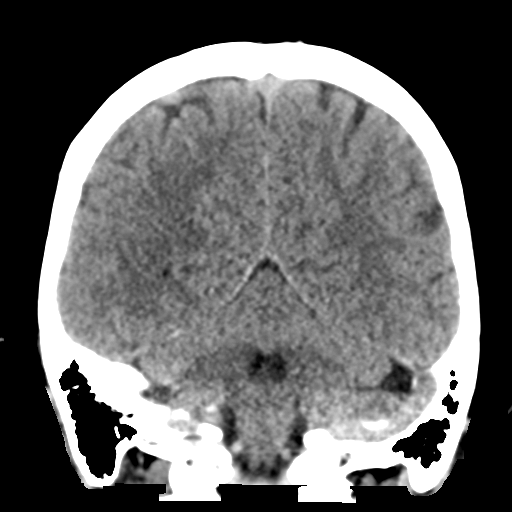
[im 28/63  brain]
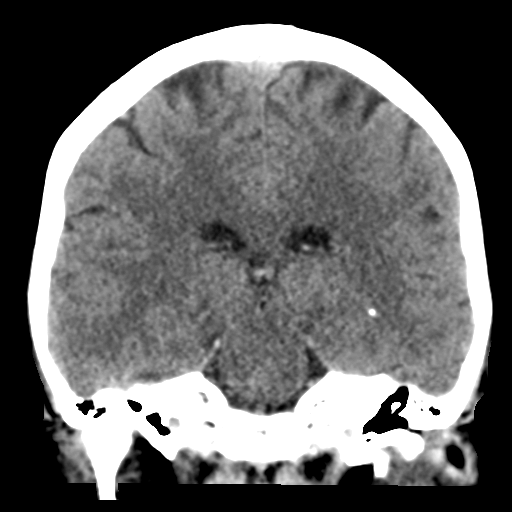
[im 35/63  brain]
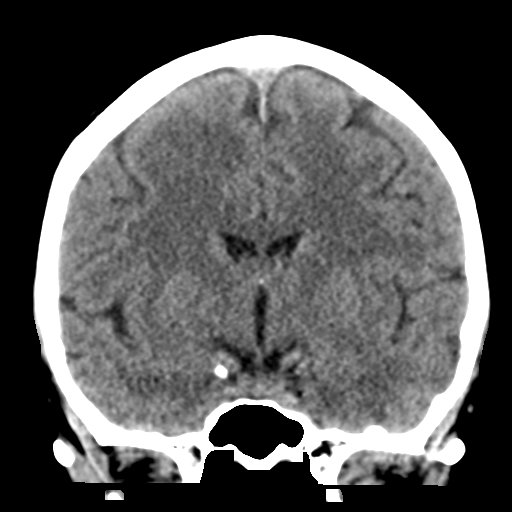

[Series 6: head without sag · sagittal · non-contrast · 0.29mm/px · 3 of 53 slices shown]
[im 18/53  brain]
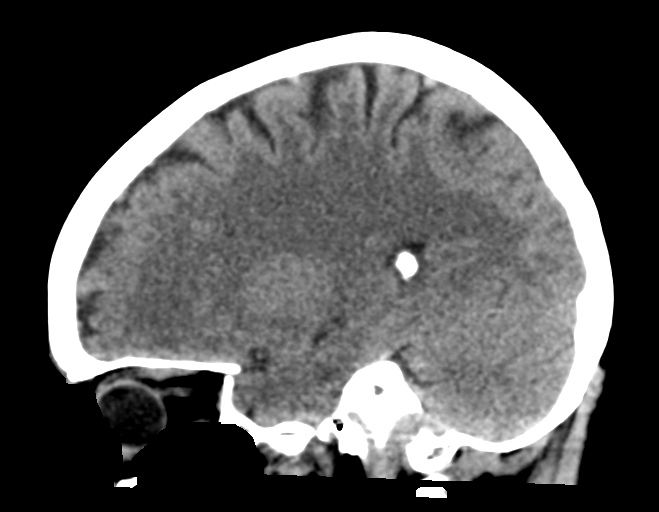
[im 27/53  brain]
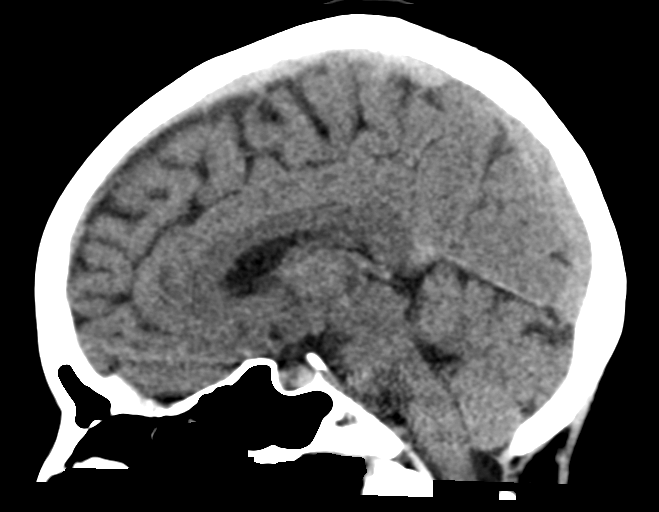
[im 35/53  brain]
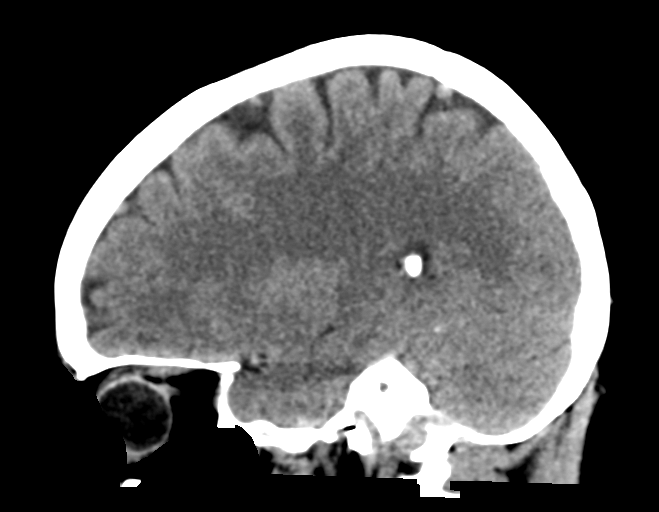

[17 of 47 positions shown; findings below may reference images not displayed]

FINDINGS: Brain: Ventricles and sulci are appropriate for patient's age. No
evidence for acute cortically based infarct, intracranial
hemorrhage, mass lesion or mass-effect.

Vascular: Unremarkable.

Skull: Intact.

Sinuses/Orbits: Paranasal sinuses are well aerated. Mastoid air
cells are unremarkable. Orbits are unremarkable.

Other: None.
IMPRESSION: No acute intracranial process.

## 2021-09-03 ENCOUNTER — Ambulatory Visit: Payer: Self-pay | Admitting: Surgery

## 2021-09-03 DIAGNOSIS — R739 Hyperglycemia, unspecified: Secondary | ICD-10-CM

## 2021-09-27 ENCOUNTER — Other Ambulatory Visit (HOSPITAL_COMMUNITY): Payer: Self-pay

## 2021-10-01 NOTE — Progress Notes (Signed)
DUE TO COVID-19 ONLY ONE VISITOR IS ALLOWED TO COME WITH YOU AND STAY IN THE WAITING ROOM ONLY DURING PRE OP AND PROCEDURE DAY OF SURGERY IF YOU ARE GOING HOME AFTER YOUR SURGERY .IF YOU ARE SPENDING THE NIGHT YOU MAY HAVE 2 VISITORS DAY OF SURGERY WHO MAY VISIT IN YOUR PRIVATE ROOM UNTIL 800 PM AND MUST GO HOME WHEN VISITING HOURS ARE OVER AT 800 PM.   YOU NEED TO HAVE A COVID 19 TEST ON__11/29/2022 _____ @__8 -3 _____, THIS TEST MUST BE DONE BEFORE SURGERY,  COVID TESTING SITE IS AT [address], [place], AND REMAIN IN YOUR CAR.  THIS IS A DRIVE UP TEST. AFTER YOUR COVID TEST, PLAESE WEAR A MASK OUT IN PUBLIC AND SOCIAL DISTANCE AND [place]re YOUR HANDS FREQUENTLY. PLEASE ASK ALL YOUR CLOSE HOUSEHOLD CONTACTS TO WEAR MASK OUT IN PUBLIC AND SOCIAL DISTANCE ALSO.                Carolyn Johns  10/01/2021   Your procedure is scheduled on:  10/12/2021   Report to [facility] Main  Entrance   Report to admitting at   [phone]     Call this number if you have problems the morning of surgery [phone]           REMEMBER: FOLLOW ALL YOUR BOWEL PREP INSTRUCTIONS WITH CLEAR LIQUIDS FROM YOUR SURGEON'S INSTRUCTIONS. DRINK 2 PRESURGERY ENSURE DRINKS THE NIGHT BEFORE SURGERY AT 1000 PM AND 1 PRESURGERY DRINK THE DAY OF THE PROCEDURE 3 HOURS PRIOR TO SCHEDULED SURGERY.  NOTHING BY MOUTH EXCEPT CLEAR LIQUIDS UNTIL THREE HOURS PRIOR TO SCHEDULED SURGERY. PLEASE FINISH PRESURGERY 3RD  ENSURE  DRINK PER SURGEON ORDER 3 HOURS PRIOR TO SCHEDULED SURGERY TIME WHICH NEEDS TO BE COMPLETED AT __0830am _______.     CLEAR LIQUID DIET   Foods Allowed                                                                      Coffee and tea, regular and decaf                           Plain Jell-O any favor except red or purple                                         Fruit ices (not with fruit pulp)                                      Iced Popsicles                                     Carbonated  beverages, regular and diet                                    Cranberry, grape and apple juices Sports drinks like Gatorade Lightly seasoned clear  broth or consume(fat free) Sugar  _____________________________________________________________________      BRUSH YOUR TEETH MORNING OF SURGERY AND RINSE YOUR MOUTH OUT, NO CHEWING GUM CANDY OR MINTS.     Take these medicines the morning of surgery with A SIP OF WATER:  Synthroid   DO NOT TAKE ANY DIABETIC MEDICATIONS DAY OF YOUR SURGERY                               You may not have any metal on your body including hair pins and              piercings  Do not wear jewelry, make-up, lotions, powders or perfumes, deodorant             Do not wear nail polish on your fingernails.  Do not shave  48 hours prior to surgery.              Men may shave face and neck.   Do not bring valuables to the hospital. [place].  Contacts, dentures or bridgework may not be worn into surgery.  Leave suitcase in the car. After surgery it may be brought to your room.                 Please read over the following fact sheets you were given: _____________________________________________________________________  [facility] - Preparing for Surgery Before surgery, you can play an important role.  Because skin is not sterile, your skin needs to be as free of germs as possible.  You can reduce the number of germs on your skin by washing with CHG (chlorahexidine gluconate) soap before surgery.  CHG is an antiseptic cleaner which kills germs and bonds with the skin to continue killing germs even after washing. Please DO NOT use if you have an allergy to CHG or antibacterial soaps.  If your skin becomes reddened/irritated stop using the CHG and inform your nurse when you arrive at Short Stay. Do not shave (including legs and underarms) for at least 48 hours prior to the first CHG shower.  You may shave your  face/neck. Please follow these instructions carefully:  1.  Shower with CHG Soap the night before surgery and the  morning of Surgery.  2.  If you choose to wash your hair, wash your hair first as usual with your  normal  shampoo.  3.  After you shampoo, rinse your hair and body thoroughly to remove the  shampoo.                           4.  Use CHG as you would any other liquid soap.  You can apply chg directly  to the skin and wash                       Gently with a scrungie or clean washcloth.  5.  Apply the CHG Soap to your body ONLY FROM THE NECK DOWN.   Do not use on face/ open                           Wound or open sores. Avoid contact with eyes, ears mouth and genitals (private parts).  Wash face,  Genitals (private parts) with your normal soap.             6.  Wash thoroughly, paying special attention to the area where your surgery  will be performed.  7.  Thoroughly rinse your body with warm water from the neck down.  8.  DO NOT shower/wash with your normal soap after using and rinsing off  the CHG Soap.                9.  Pat yourself dry with a clean towel.            10.  Wear clean pajamas.            11.  Place clean sheets on your bed the night of your first shower and do not  sleep with pets. Day of Surgery : Do not apply any lotions/deodorants the morning of surgery.  Please wear clean clothes to the hospital/surgery center.  FAILURE TO FOLLOW THESE INSTRUCTIONS MAY RESULT IN THE CANCELLATION OF YOUR SURGERY PATIENT SIGNATURE_________________________________  NURSE SIGNATURE__________________________________  ________________________________________________________________________

## 2021-10-02 NOTE — Patient Instructions (Addendum)
DUE TO COVID-19 ONLY ONE VISITOR IS ALLOWED TO COME WITH YOU AND STAY IN THE WAITING ROOM ONLY DURING PRE OP AND PROCEDURE.   **NO VISITORS ARE ALLOWED IN THE SHORT STAY AREA OR RECOVERY ROOM!!**  IF YOU WILL BE ADMITTED INTO THE HOSPITAL YOU ARE ALLOWED ONLY TWO SUPPORT PEOPLE DURING VISITATION HOURS ONLY (7AM -8PM)   The support person(s) may change daily. The support person(s) must pass our screening, gel in and out, and wear a mask at all times, including in the patient's room. Patients must also wear a mask when staff or their support person are in the room.  No visitors under the age of 65. Any visitor under the age of 76 must be accompanied by an adult.    COVID SWAB TESTING MUST BE COMPLETED ON:  10/10/21 **MUST PRESENT COMPLETED FORM AT TESTING SITE**    [facility] [place] (backside of the building) Open 8am-3pm. No appointment needed. You are not required to quarantine, however you are required to wear a well-fitted mask when you are out and around people not in your household.  Hand Hygiene often Do NOT share personal items Notify your provider if you are in close contact with someone who has COVID or you develop fever 100.4 or greater, new onset of sneezing, cough, sore throat, shortness of breath or body aches.       Your procedure is scheduled on: 10/12/21   Report to [facility] Main Entrance    Report to admitting at 9:15 AM   Call this number if you have problems the morning of surgery [phone]   Follow clear liquid diet and bowel prep instructions day before surgery given to you by your surgeon office   May have liquids until 8:30 day of surgery  CLEAR LIQUID DIET  Foods Allowed                                                                     Foods Excluded  Water, Black Coffee and tea (no milk or creamer)           liquids that you cannot  Plain Jell-O in any flavor  (No red)                                     see through such  as: Fruit ices (not with fruit pulp)                                           milk, soups, orange juice              Iced Popsicles (No red)                                              All solid food  Apple juices Sports drinks like Gatorade (No red) Lightly seasoned clear broth or consume(fat free) Sugar  Sample Menu Breakfast                                Lunch                                     Supper Cranberry juice                    Beef broth                            Chicken broth Jell-O                                     Grape juice                           Apple juice Coffee or tea                        Jell-O                                      Popsicle                                                Coffee or tea                        Coffee or tea     Drink 2 Ensure drinks the night before surgery, have done by 10pm.  Complete one Ensure drink the morning of surgery 3 hours prior to scheduled surgery, have done by 8:30 AM.     The day of surgery:  Drink ONE (1) Pre-Surgery Clear Ensure by 8:30 am the morning of surgery. Drink in one sitting. Do not sip.  This drink was given to you during your hospital  pre-op appointment visit. Nothing else to drink after completing the  Pre-Surgery Clear Ensure.          If you have questions, please contact your surgeon's office.     Oral Hygiene is also important to reduce your risk of infection.                                    Remember - BRUSH YOUR TEETH THE MORNING OF SURGERY WITH YOUR REGULAR TOOTHPASTE   Do NOT smoke after Midnight   Take these medicines the morning of surgery with A SIP OF WATER: Synthroid                              You may not have any metal on your body including hair pins, jewelry, and body piercing             Do  not wear make-up, lotions, powders, perfumes, or deodorant  Do not wear nail polish including gel and S&S, artificial/acrylic nails, or any other  type of covering on natural nails including finger and toenails. If you have artificial nails, gel coating, etc. that needs to be removed by a nail salon please have this removed prior to surgery or surgery may need to be canceled/ delayed if the surgeon/ anesthesia feels like they are unable to be safely monitored.   Do not shave  48 hours prior to surgery.    Do not bring valuables to the hospital. [place].   Contacts, dentures or bridgework may not be worn into surgery.   Bring small overnight bag day of surgery.              Please read over the following fact sheets you were given: IF YOU HAVE QUESTIONS ABOUT YOUR PRE-OP INSTRUCTIONS PLEASE CALL [facility] - Preparing for Surgery Before surgery, you can play an important role.  Because skin is not sterile, your skin needs to be as free of germs as possible.  You can reduce the number of germs on your skin by washing with CHG (chlorahexidine gluconate) soap before surgery.  CHG is an antiseptic cleaner which kills germs and bonds with the skin to continue killing germs even after washing. Please DO NOT use if you have an allergy to CHG or antibacterial soaps.  If your skin becomes reddened/irritated stop using the CHG and inform your nurse when you arrive at Short Stay. Do not shave (including legs and underarms) for at least 48 hours prior to the first CHG shower.  You may shave your face/neck.  Please follow these instructions carefully:  1.  Shower with CHG Soap the night before surgery and the  morning of surgery.  2.  If you choose to wash your hair, wash your hair first as usual with your normal  shampoo.  3.  After you shampoo, rinse your hair and body thoroughly to remove the shampoo.                             4.  Use CHG as you would any other liquid soap.  You can apply chg directly to the skin and wash.  Gently with a scrungie or clean washcloth.  5.   Apply the CHG Soap to your body ONLY FROM THE NECK DOWN.   Do   not use on face/ open                           Wound or open sores. Avoid contact with eyes, ears mouth and   genitals (private parts).                       Wash face,  Genitals (private parts) with your normal soap.             6.  Wash thoroughly, paying special attention to the area where your    surgery  will be performed.  7.  Thoroughly rinse your body with warm water from the neck down.  8.  DO NOT shower/wash with your normal soap after using and rinsing off the CHG Soap.  9.  Pat yourself dry with a clean towel.            10.  Wear clean pajamas.            11.  Place clean sheets on your bed the night of your first shower and do not  sleep with pets. Day of Surgery : Do not apply any lotions/deodorants the morning of surgery.  Please wear clean clothes to the hospital/surgery center.  FAILURE TO FOLLOW THESE INSTRUCTIONS MAY RESULT IN THE CANCELLATION OF YOUR SURGERY  PATIENT SIGNATURE_________________________________  NURSE SIGNATURE__________________________________  ________________________________________________________________________   Carolyn Johns  An incentive spirometer is a tool that can help keep your lungs clear and active. This tool measures how well you are filling your lungs with each breath. Taking long deep breaths may help reverse or decrease the chance of developing breathing (pulmonary) problems (especially infection) following: A long period of time when you are unable to move or be active. BEFORE THE PROCEDURE  If the spirometer includes an indicator to show your best effort, your nurse or respiratory therapist will set it to a desired goal. If possible, sit up straight or lean slightly forward. Try not to slouch. Hold the incentive spirometer in an upright position. INSTRUCTIONS FOR USE  Sit on the edge of your bed if possible, or sit up as far as you can in bed or  on a chair. Hold the incentive spirometer in an upright position. Breathe out normally. Place the mouthpiece in your mouth and seal your lips tightly around it. Breathe in slowly and as deeply as possible, raising the piston or the ball toward the top of the column. Hold your breath for 3-5 seconds or for as long as possible. Allow the piston or ball to fall to the bottom of the column. Remove the mouthpiece from your mouth and breathe out normally. Rest for a few seconds and repeat Steps 1 through 7 at least 10 times every 1-2 hours when you are awake. Take your time and take a few normal breaths between deep breaths. The spirometer may include an indicator to show your best effort. Use the indicator as a goal to work toward during each repetition. After each set of 10 deep breaths, practice coughing to be sure your lungs are clear. If you have an incision (the cut made at the time of surgery), support your incision when coughing by placing a pillow or rolled up towels firmly against it. Once you are able to get out of bed, walk around indoors and cough well. You may stop using the incentive spirometer when instructed by your caregiver.  RISKS AND COMPLICATIONS Take your time so you do not get dizzy or light-headed. If you are in pain, you may need to take or ask for pain medication before doing incentive spirometry. It is harder to take a deep breath if you are having pain. AFTER USE Rest and breathe slowly and easily. It can be helpful to keep track of a log of your progress. Your caregiver can provide you with a simple table to help with this. If you are using the spirometer at home, follow these instructions: [place] IF:  You are having difficultly using the spirometer. You have trouble using the spirometer as often as instructed. Your pain medication is not giving enough relief while using the spirometer. You develop fever of 100.5 F (38.1 C) or higher. SEEK IMMEDIATE MEDICAL  CARE IF:  You cough up bloody sputum that  had not been present before. You develop fever of 102 F (38.9 C) or greater. You develop worsening pain at or near the incision site. MAKE SURE YOU:  Understand these instructions. Will watch your condition. Will get help right away if you are not doing well or get worse. Document Released: 03/10/2007 Document Revised: 01/20/2012 Document Reviewed: 05/11/2007 [facility] Patient Information 2014 [place], [place].   ________________________________________________________________________

## 2021-10-02 NOTE — Progress Notes (Addendum)
COVID swab appointment: 10/10/21  COVID Vaccine Completed: yes x5 Date COVID Vaccine completed: Has received booster: COVID vaccine manufacturer: Pfizer      Date of COVID positive in last 90 days: no  PCP - Claris Gower, MD Cardiologist - n/a  Chest x-ray - n/a EKG - n/a Stress Test - 01/17/18 Epic ECHO - 01/16/18 Epic Cardiac Cath - n/a Pacemaker/ICD device last checked: n/a Spinal Cord Stimulator: n/a  Sleep Study - n/a CPAP -   Fasting Blood Sugar - n/a Checks Blood Sugar _____ times a day  Blood Thinner Instructions: n/a Aspirin Instructions: Last Dose:  Activity level: Can go up a flight of stairs and perform activities of daily living without stopping and without symptoms of chest pain or shortness of breath. SOB occ due to COPD     Anesthesia review:   Patient denies shortness of breath, fever, cough and chest pain at PAT appointment   Patient verbalized understanding of instructions that were given to them at the PAT appointment. Patient was also instructed that they will need to review over the PAT instructions again at home before surgery.

## 2021-10-03 ENCOUNTER — Encounter (HOSPITAL_COMMUNITY)
Admission: RE | Admit: 2021-10-03 | Discharge: 2021-10-03 | Disposition: A | Discharge status: Home / Self Care | Payer: 59 | Source: Ambulatory Visit | Attending: Surgery | Admitting: Surgery

## 2021-10-03 ENCOUNTER — Encounter (HOSPITAL_COMMUNITY): Payer: Self-pay

## 2021-10-03 ENCOUNTER — Other Ambulatory Visit: Payer: Self-pay

## 2021-10-03 DIAGNOSIS — K51411 Inflammatory polyps of colon with rectal bleeding: Secondary | ICD-10-CM

## 2021-10-03 DIAGNOSIS — R739 Hyperglycemia, unspecified: Secondary | ICD-10-CM

## 2021-10-03 DIAGNOSIS — Z01812 Encounter for preprocedural laboratory examination: Secondary | ICD-10-CM | POA: Diagnosis present

## 2021-10-03 HISTORY — DX: Chronic obstructive pulmonary disease, unspecified: J44.9

## 2021-10-03 HISTORY — DX: Pneumonia, unspecified organism: J18.9

## 2021-10-03 HISTORY — DX: Gastro-esophageal reflux disease without esophagitis: K21.9

## 2021-10-03 LAB — CBC
HCT: 42.1 % (ref 36.0–46.0)
Hemoglobin: 13.3 g/dL (ref 12.0–15.0)
MCH: 29 pg (ref 26.0–34.0)
MCHC: 31.6 g/dL (ref 30.0–36.0)
MCV: 91.9 fL (ref 80.0–100.0)
Platelets: 235 10*3/uL (ref 150–400)
RBC: 4.58 MIL/uL (ref 3.87–5.11)
RDW: 13.6 % (ref 11.5–15.5)
WBC: 5.3 10*3/uL (ref 4.0–10.5)
nRBC: 0 % (ref 0.0–0.2)

## 2021-10-03 LAB — HEMOGLOBIN A1C
Hgb A1c MFr Bld: 5.5 % (ref 4.8–5.6)
Mean Plasma Glucose: 111.15 mg/dL

## 2021-10-03 LAB — BASIC METABOLIC PANEL
Anion gap: 7 (ref 5–15)
BUN: 12 mg/dL (ref 8–23)
CO2: 25 mmol/L (ref 22–32)
Calcium: 8.9 mg/dL (ref 8.9–10.3)
Chloride: 107 mmol/L (ref 98–111)
Creatinine, Ser: 0.58 mg/dL (ref 0.44–1.00)
GFR, Estimated: >60 mL/min (ref >60)
Glucose, Bld: 92 mg/dL (ref 70–99)
Potassium: 4.3 mmol/L (ref 3.5–5.1)
Sodium: 139 mmol/L (ref 135–145)

## 2021-10-10 ENCOUNTER — Other Ambulatory Visit: Payer: Self-pay | Admitting: Surgery

## 2021-10-10 LAB — SARS CORONAVIRUS 2 (TAT 6-24 HRS): SARS Coronavirus 2: NEGATIVE

## 2021-10-12 ENCOUNTER — Inpatient Hospital Stay (HOSPITAL_COMMUNITY)
Admission: RE | Admit: 2021-10-12 | Discharge: 2021-10-14 | DRG: 331 | Disposition: A | Discharge status: Home / Self Care | Payer: 59 | Source: Ambulatory Visit | Attending: Surgery | Admitting: Surgery

## 2021-10-12 ENCOUNTER — Inpatient Hospital Stay (HOSPITAL_COMMUNITY): Payer: 59 | Admitting: Certified Registered Nurse Anesthetist

## 2021-10-12 ENCOUNTER — Other Ambulatory Visit (HOSPITAL_COMMUNITY): Payer: Self-pay

## 2021-10-12 ENCOUNTER — Encounter (HOSPITAL_COMMUNITY)
Admission: RE | Disposition: A | Discharge status: Home / Self Care | Payer: Self-pay | Source: Ambulatory Visit | Attending: Surgery

## 2021-10-12 ENCOUNTER — Other Ambulatory Visit: Payer: Self-pay

## 2021-10-12 ENCOUNTER — Encounter (HOSPITAL_COMMUNITY): Payer: Self-pay | Admitting: Surgery

## 2021-10-12 DIAGNOSIS — J449 Chronic obstructive pulmonary disease, unspecified: Secondary | ICD-10-CM

## 2021-10-12 DIAGNOSIS — K219 Gastro-esophageal reflux disease without esophagitis: Secondary | ICD-10-CM | POA: Diagnosis present

## 2021-10-12 DIAGNOSIS — R918 Other nonspecific abnormal finding of lung field: Secondary | ICD-10-CM

## 2021-10-12 DIAGNOSIS — E039 Hypothyroidism, unspecified: Secondary | ICD-10-CM | POA: Diagnosis present

## 2021-10-12 DIAGNOSIS — I951 Orthostatic hypotension: Secondary | ICD-10-CM | POA: Diagnosis present

## 2021-10-12 DIAGNOSIS — F17210 Nicotine dependence, cigarettes, uncomplicated: Secondary | ICD-10-CM | POA: Diagnosis present

## 2021-10-12 DIAGNOSIS — Z885 Allergy status to narcotic agent status: Secondary | ICD-10-CM | POA: Diagnosis not present

## 2021-10-12 DIAGNOSIS — D126 Benign neoplasm of colon, unspecified: Secondary | ICD-10-CM | POA: Diagnosis present

## 2021-10-12 DIAGNOSIS — D122 Benign neoplasm of ascending colon: Principal | ICD-10-CM | POA: Diagnosis present

## 2021-10-12 DIAGNOSIS — Z79890 Hormone replacement therapy (postmenopausal): Secondary | ICD-10-CM

## 2021-10-12 DIAGNOSIS — K51411 Inflammatory polyps of colon with rectal bleeding: Secondary | ICD-10-CM

## 2021-10-12 DIAGNOSIS — K6389 Other specified diseases of intestine: Secondary | ICD-10-CM

## 2021-10-12 SURGERY — COLECTOMY, SIGMOID, ROBOT-ASSISTED
Anesthesia: General | Site: Abdomen

## 2021-10-12 MED ORDER — PHENYLEPHRINE 40 MCG/ML (10ML) SYRINGE FOR IV PUSH (FOR BLOOD PRESSURE SUPPORT)
PREFILLED_SYRINGE | INTRAVENOUS | Status: DC | PRN
  Administered 2021-10-12 (×2): 120 ug via INTRAVENOUS
  Administered 2021-10-12: 80 ug via INTRAVENOUS

## 2021-10-12 MED ORDER — ONDANSETRON HCL 4 MG/2ML IJ SOLN
INTRAMUSCULAR | Status: DC | PRN
  Administered 2021-10-12: 4 mg via INTRAVENOUS

## 2021-10-12 MED ORDER — FENTANYL CITRATE (PF) 100 MCG/2ML IJ SOLN
INTRAMUSCULAR | Status: AC
  Filled 2021-10-12: qty 2

## 2021-10-12 MED ORDER — ROCURONIUM BROMIDE 10 MG/ML (PF) SYRINGE
PREFILLED_SYRINGE | INTRAVENOUS | Status: AC
  Filled 2021-10-12: qty 10

## 2021-10-12 MED ORDER — HYDROMORPHONE HCL 1 MG/ML IJ SOLN: 0.2500 mg | INTRAMUSCULAR | Status: DC | PRN

## 2021-10-12 MED ORDER — ACETAMINOPHEN 500 MG PO TABS: 1000.0000 mg | ORAL_TABLET | Freq: Once | ORAL | Status: DC

## 2021-10-12 MED ORDER — LACTATED RINGERS IV SOLN: INTRAVENOUS | Status: DC

## 2021-10-12 MED ORDER — ONDANSETRON HCL 4 MG/2ML IJ SOLN
4.0000 mg | Freq: Four times a day (QID) | INTRAMUSCULAR | Status: DC | PRN
  Administered 2021-10-13: 4 mg via INTRAVENOUS
  Filled 2021-10-12: qty 2

## 2021-10-12 MED ORDER — SIMETHICONE 80 MG PO CHEW
40.0000 mg | CHEWABLE_TABLET | Freq: Four times a day (QID) | ORAL | Status: DC | PRN
  Filled 2021-10-12: qty 1

## 2021-10-12 MED ORDER — POLYETHYLENE GLYCOL 3350 17 GM/SCOOP PO POWD: 1.0000 | Freq: Once | ORAL | Status: DC

## 2021-10-12 MED ORDER — LACTATED RINGERS IV BOLUS: 1000.0000 mL | Freq: Three times a day (TID) | INTRAVENOUS | Status: DC | PRN

## 2021-10-12 MED ORDER — LIDOCAINE HCL 2 % IJ SOLN
INTRAMUSCULAR | Status: AC
  Filled 2021-10-12: qty 20

## 2021-10-12 MED ORDER — CALCIUM POLYCARBOPHIL 625 MG PO TABS
625.0000 mg | ORAL_TABLET | Freq: Two times a day (BID) | ORAL | Status: DC
  Administered 2021-10-12 – 2021-10-14 (×4): 625 mg via ORAL
  Filled 2021-10-12 (×4): qty 1

## 2021-10-12 MED ORDER — LACTATED RINGERS IR SOLN
Status: DC | PRN
  Administered 2021-10-12: 1000 mL

## 2021-10-12 MED ORDER — EPHEDRINE 5 MG/ML INJ
INTRAVENOUS | Status: AC
  Filled 2021-10-12: qty 5

## 2021-10-12 MED ORDER — METRONIDAZOLE 500 MG PO TABS: 1000.0000 mg | ORAL_TABLET | ORAL | Status: DC

## 2021-10-12 MED ORDER — LIP MEDEX EX OINT
1.0000 "application " | TOPICAL_OINTMENT | Freq: Two times a day (BID) | CUTANEOUS | Status: DC
  Administered 2021-10-12 – 2021-10-14 (×4): 1 via TOPICAL
  Filled 2021-10-12: qty 7

## 2021-10-12 MED ORDER — METHOCARBAMOL 1000 MG/10ML IJ SOLN
1000.0000 mg | Freq: Four times a day (QID) | INTRAVENOUS | Status: DC | PRN
  Filled 2021-10-12: qty 10

## 2021-10-12 MED ORDER — CHLORHEXIDINE GLUCONATE 0.12 % MT SOLN
15.0000 mL | Freq: Once | OROMUCOSAL | Status: AC
  Administered 2021-10-12: 15 mL via OROMUCOSAL

## 2021-10-12 MED ORDER — TRAMADOL HCL 50 MG PO TABS
50.0000 mg | ORAL_TABLET | Freq: Four times a day (QID) | ORAL | Status: DC | PRN
  Administered 2021-10-12 – 2021-10-14 (×5): 100 mg via ORAL
  Filled 2021-10-12 (×5): qty 2

## 2021-10-12 MED ORDER — SODIUM CHLORIDE 0.9 % IV SOLN: Freq: Three times a day (TID) | INTRAVENOUS | Status: DC | PRN

## 2021-10-12 MED ORDER — LEVOTHYROXINE SODIUM 100 MCG PO TABS
100.0000 ug | ORAL_TABLET | Freq: Every day | ORAL | Status: DC
Start: 2021-10-13 — End: 2021-10-14
  Administered 2021-10-13 – 2021-10-14 (×2): 100 ug via ORAL
  Filled 2021-10-12 (×2): qty 1

## 2021-10-12 MED ORDER — PROCHLORPERAZINE EDISYLATE 10 MG/2ML IJ SOLN: 5.0000 mg | Freq: Four times a day (QID) | INTRAMUSCULAR | Status: DC | PRN

## 2021-10-12 MED ORDER — SUGAMMADEX SODIUM 200 MG/2ML IV SOLN
INTRAVENOUS | Status: DC | PRN
  Administered 2021-10-12: 200 mg via INTRAVENOUS

## 2021-10-12 MED ORDER — MEPERIDINE HCL 50 MG/ML IJ SOLN: 6.2500 mg | INTRAMUSCULAR | Status: DC | PRN

## 2021-10-12 MED ORDER — PROMETHAZINE HCL 25 MG/ML IJ SOLN: 6.2500 mg | INTRAMUSCULAR | Status: DC | PRN

## 2021-10-12 MED ORDER — NEOMYCIN SULFATE 500 MG PO TABS: 1000.0000 mg | ORAL_TABLET | ORAL | Status: DC

## 2021-10-12 MED ORDER — LIDOCAINE HCL (PF) 2 % IJ SOLN
INTRAMUSCULAR | Status: DC | PRN
  Administered 2021-10-12: 1 mg/kg/h via INTRADERMAL

## 2021-10-12 MED ORDER — PHENYLEPHRINE HCL (PRESSORS) 10 MG/ML IV SOLN
INTRAVENOUS | Status: AC
  Filled 2021-10-12: qty 1

## 2021-10-12 MED ORDER — MIDAZOLAM HCL 2 MG/2ML IJ SOLN: 0.5000 mg | Freq: Once | INTRAMUSCULAR | Status: DC | PRN

## 2021-10-12 MED ORDER — ALVIMOPAN 12 MG PO CAPS
12.0000 mg | ORAL_CAPSULE | Freq: Two times a day (BID) | ORAL | Status: DC
  Administered 2021-10-13: 12 mg via ORAL
  Filled 2021-10-12 (×2): qty 1

## 2021-10-12 MED ORDER — PROCHLORPERAZINE MALEATE 10 MG PO TABS
10.0000 mg | ORAL_TABLET | Freq: Four times a day (QID) | ORAL | Status: DC | PRN
  Filled 2021-10-12: qty 1

## 2021-10-12 MED ORDER — SODIUM CHLORIDE 0.9 % IV SOLN
2.0000 g | Freq: Two times a day (BID) | INTRAVENOUS | Status: AC
  Administered 2021-10-12: 2 g via INTRAVENOUS
  Filled 2021-10-12: qty 2

## 2021-10-12 MED ORDER — DIPHENHYDRAMINE HCL 12.5 MG/5ML PO ELIX: 12.5000 mg | ORAL_SOLUTION | Freq: Four times a day (QID) | ORAL | Status: DC | PRN

## 2021-10-12 MED ORDER — GABAPENTIN 300 MG PO CAPS
300.0000 mg | ORAL_CAPSULE | ORAL | Status: AC
  Administered 2021-10-12: 300 mg via ORAL
  Filled 2021-10-12: qty 1

## 2021-10-12 MED ORDER — BISACODYL 5 MG PO TBEC: 20.0000 mg | DELAYED_RELEASE_TABLET | Freq: Once | ORAL | Status: DC

## 2021-10-12 MED ORDER — ENSURE SURGERY PO LIQD
237.0000 mL | Freq: Two times a day (BID) | ORAL | Status: DC
  Administered 2021-10-12 – 2021-10-14 (×3): 237 mL via ORAL

## 2021-10-12 MED ORDER — 0.9 % SODIUM CHLORIDE (POUR BTL) OPTIME
TOPICAL | Status: DC | PRN
  Administered 2021-10-12: 2000 mL

## 2021-10-12 MED ORDER — OXYCODONE HCL 5 MG PO TABS: 5.0000 mg | ORAL_TABLET | Freq: Once | ORAL | Status: AC | PRN

## 2021-10-12 MED ORDER — EPHEDRINE SULFATE-NACL 50-0.9 MG/10ML-% IV SOSY
PREFILLED_SYRINGE | INTRAVENOUS | Status: DC | PRN
  Administered 2021-10-12: 10 mg via INTRAVENOUS

## 2021-10-12 MED ORDER — FENTANYL CITRATE (PF) 100 MCG/2ML IJ SOLN
INTRAMUSCULAR | Status: DC | PRN
  Administered 2021-10-12 (×4): 50 ug via INTRAVENOUS

## 2021-10-12 MED ORDER — PHENYLEPHRINE 40 MCG/ML (10ML) SYRINGE FOR IV PUSH (FOR BLOOD PRESSURE SUPPORT)
PREFILLED_SYRINGE | INTRAVENOUS | Status: AC
  Filled 2021-10-12: qty 10

## 2021-10-12 MED ORDER — LACTATED RINGERS IV SOLN: INTRAVENOUS | Status: AC

## 2021-10-12 MED ORDER — ONDANSETRON HCL 4 MG PO TABS: 4.0000 mg | ORAL_TABLET | Freq: Four times a day (QID) | ORAL | Status: DC | PRN

## 2021-10-12 MED ORDER — ONDANSETRON HCL 4 MG/2ML IJ SOLN
INTRAMUSCULAR | Status: AC
  Filled 2021-10-12: qty 2

## 2021-10-12 MED ORDER — ROCURONIUM BROMIDE 10 MG/ML (PF) SYRINGE
PREFILLED_SYRINGE | INTRAVENOUS | Status: DC | PRN
  Administered 2021-10-12: 20 mg via INTRAVENOUS
  Administered 2021-10-12: 60 mg via INTRAVENOUS

## 2021-10-12 MED ORDER — METOPROLOL TARTRATE 5 MG/5ML IV SOLN: 5.0000 mg | Freq: Four times a day (QID) | INTRAVENOUS | Status: DC | PRN

## 2021-10-12 MED ORDER — HYDROMORPHONE HCL 1 MG/ML IJ SOLN
0.5000 mg | INTRAMUSCULAR | Status: DC | PRN
  Administered 2021-10-12: 1 mg via INTRAVENOUS
  Filled 2021-10-12: qty 1

## 2021-10-12 MED ORDER — BUPIVACAINE LIPOSOME 1.3 % IJ SUSP
INTRAMUSCULAR | Status: DC | PRN
  Administered 2021-10-12: 20 mL

## 2021-10-12 MED ORDER — BUPIVACAINE-EPINEPHRINE (PF) 0.25% -1:200000 IJ SOLN
INTRAMUSCULAR | Status: DC | PRN
  Administered 2021-10-12: 60 mL

## 2021-10-12 MED ORDER — BUPIVACAINE LIPOSOME 1.3 % IJ SUSP: 20.0000 mL | Freq: Once | INTRAMUSCULAR | Status: DC

## 2021-10-12 MED ORDER — ACETAMINOPHEN 500 MG PO TABS
1000.0000 mg | ORAL_TABLET | ORAL | Status: AC
  Administered 2021-10-12: 1000 mg via ORAL
  Filled 2021-10-12: qty 2

## 2021-10-12 MED ORDER — ALUM & MAG HYDROXIDE-SIMETH 200-200-20 MG/5ML PO SUSP: 30.0000 mL | Freq: Four times a day (QID) | ORAL | Status: DC | PRN

## 2021-10-12 MED ORDER — METHOCARBAMOL 500 MG PO TABS
1000.0000 mg | ORAL_TABLET | Freq: Four times a day (QID) | ORAL | Status: DC | PRN
  Administered 2021-10-12 – 2021-10-14 (×5): 1000 mg via ORAL
  Filled 2021-10-12 (×5): qty 2

## 2021-10-12 MED ORDER — ENSURE PRE-SURGERY PO LIQD: 296.0000 mL | Freq: Once | ORAL | Status: DC

## 2021-10-12 MED ORDER — ENOXAPARIN SODIUM 40 MG/0.4ML IJ SOSY
40.0000 mg | PREFILLED_SYRINGE | Freq: Once | INTRAMUSCULAR | Status: AC
  Administered 2021-10-12: 40 mg via SUBCUTANEOUS
  Filled 2021-10-12: qty 0.4

## 2021-10-12 MED ORDER — ALVIMOPAN 12 MG PO CAPS
12.0000 mg | ORAL_CAPSULE | ORAL | Status: AC
  Administered 2021-10-12: 12 mg via ORAL
  Filled 2021-10-12: qty 1

## 2021-10-12 MED ORDER — DIPHENHYDRAMINE HCL 50 MG/ML IJ SOLN: 12.5000 mg | Freq: Four times a day (QID) | INTRAMUSCULAR | Status: DC | PRN

## 2021-10-12 MED ORDER — ENOXAPARIN SODIUM 40 MG/0.4ML IJ SOSY
40.0000 mg | PREFILLED_SYRINGE | INTRAMUSCULAR | Status: DC
  Administered 2021-10-13 – 2021-10-14 (×2): 40 mg via SUBCUTANEOUS
  Filled 2021-10-12 (×2): qty 0.4

## 2021-10-12 MED ORDER — SODIUM CHLORIDE 0.9 % IV SOLN
2.0000 g | INTRAVENOUS | Status: AC
  Administered 2021-10-12: 2 g via INTRAVENOUS
  Filled 2021-10-12: qty 2

## 2021-10-12 MED ORDER — DEXAMETHASONE SODIUM PHOSPHATE 10 MG/ML IJ SOLN
INTRAMUSCULAR | Status: AC
  Filled 2021-10-12: qty 1

## 2021-10-12 MED ORDER — OXYCODONE HCL 5 MG PO TABS
ORAL_TABLET | ORAL | Status: AC
  Administered 2021-10-12: 5 mg via ORAL
  Filled 2021-10-12: qty 1

## 2021-10-12 MED ORDER — KETAMINE HCL 10 MG/ML IJ SOLN
INTRAMUSCULAR | Status: DC | PRN
  Administered 2021-10-12: 25 mg via INTRAVENOUS

## 2021-10-12 MED ORDER — TRAMADOL HCL 50 MG PO TABS
50.0000 mg | ORAL_TABLET | Freq: Four times a day (QID) | ORAL | 0 refills | Status: DC | PRN
  Filled 2021-10-12: qty 20, 3d supply, fill #0

## 2021-10-12 MED ORDER — BUPIVACAINE LIPOSOME 1.3 % IJ SUSP
INTRAMUSCULAR | Status: AC
  Filled 2021-10-12: qty 20

## 2021-10-12 MED ORDER — BUPIVACAINE-EPINEPHRINE (PF) 0.25% -1:200000 IJ SOLN
INTRAMUSCULAR | Status: AC
  Filled 2021-10-12: qty 60

## 2021-10-12 MED ORDER — OXYCODONE HCL 5 MG/5ML PO SOLN: 5.0000 mg | Freq: Once | ORAL | Status: AC | PRN

## 2021-10-12 MED ORDER — PROPOFOL 10 MG/ML IV BOLUS
INTRAVENOUS | Status: AC
  Filled 2021-10-12: qty 20

## 2021-10-12 MED ORDER — DEXAMETHASONE SODIUM PHOSPHATE 4 MG/ML IJ SOLN
INTRAMUSCULAR | Status: DC | PRN
  Administered 2021-10-12: 5 mg via INTRAVENOUS

## 2021-10-12 MED ORDER — PROPOFOL 10 MG/ML IV BOLUS
INTRAVENOUS | Status: DC | PRN
  Administered 2021-10-12: 120 mg via INTRAVENOUS

## 2021-10-12 MED ORDER — MAGIC MOUTHWASH
15.0000 mL | Freq: Four times a day (QID) | ORAL | Status: DC | PRN
  Filled 2021-10-12: qty 15

## 2021-10-12 MED ORDER — ENSURE PRE-SURGERY PO LIQD
592.0000 mL | Freq: Once | ORAL | Status: DC
  Filled 2021-10-12: qty 592

## 2021-10-12 MED ORDER — ORAL CARE MOUTH RINSE: 15.0000 mL | Freq: Once | OROMUCOSAL | Status: AC

## 2021-10-12 MED ORDER — ALBUTEROL SULFATE (2.5 MG/3ML) 0.083% IN NEBU: 2.5000 mg | INHALATION_SOLUTION | Freq: Four times a day (QID) | RESPIRATORY_TRACT | Status: DC | PRN

## 2021-10-12 MED ORDER — PHENYLEPHRINE HCL-NACL 20-0.9 MG/250ML-% IV SOLN
INTRAVENOUS | Status: DC | PRN
  Administered 2021-10-12: 50 ug/min via INTRAVENOUS

## 2021-10-12 MED ORDER — ACETAMINOPHEN 500 MG PO TABS
1000.0000 mg | ORAL_TABLET | Freq: Four times a day (QID) | ORAL | Status: DC
  Administered 2021-10-12 – 2021-10-14 (×6): 1000 mg via ORAL
  Filled 2021-10-12 (×6): qty 2

## 2021-10-12 MED ORDER — MELATONIN 5 MG PO TABS: 10.0000 mg | ORAL_TABLET | Freq: Every evening | ORAL | Status: DC | PRN

## 2021-10-12 SURGICAL SUPPLY — 122 items
APL PRP STRL LF DISP 70% ISPRP (MISCELLANEOUS)
APPLIER CLIP 5 13 M/L LIGAMAX5 (MISCELLANEOUS)
APPLIER CLIP ROT 10 11.4 M/L (STAPLE)
APR CLP MED LRG 11.4X10 (STAPLE)
APR CLP MED LRG 5 ANG JAW (MISCELLANEOUS)
BAG COUNTER SPONGE SURGICOUNT (BAG) ×2 IMPLANT
BAG SPNG CNTER NS LX DISP (BAG) ×1
BAG SURGICOUNT SPONGE COUNTING (BAG) ×1
BLADE EXTENDED COATED 6.5IN (ELECTRODE) IMPLANT
CANNULA REDUC XI 12-8 STAPL (CANNULA) ×2
CANNULA REDUC XI 12-8MM STAPL (CANNULA) ×1
CANNULA REDUCER 12-8 DVNC XI (CANNULA) IMPLANT
CELLS DAT CNTRL 66122 CELL SVR (MISCELLANEOUS) IMPLANT
CHLORAPREP W/TINT 26 (MISCELLANEOUS) IMPLANT
CLIP APPLIE 5 13 M/L LIGAMAX5 (MISCELLANEOUS) IMPLANT
CLIP APPLIE ROT 10 11.4 M/L (STAPLE) IMPLANT
COVER SURGICAL LIGHT HANDLE (MISCELLANEOUS) ×4 IMPLANT
COVER TIP SHEARS 8 DVNC (MISCELLANEOUS) ×1 IMPLANT
COVER TIP SHEARS 8MM DA VINCI (MISCELLANEOUS) ×3
DECANTER SPIKE VIAL GLASS SM (MISCELLANEOUS) ×1 IMPLANT
DEVICE TROCAR PUNCTURE CLOSURE (ENDOMECHANICALS) IMPLANT
DRAIN CHANNEL 19F RND (DRAIN) IMPLANT
DRAPE ARM DVNC X/XI (DISPOSABLE) ×4 IMPLANT
DRAPE COLUMN DVNC XI (DISPOSABLE) ×1 IMPLANT
DRAPE DA VINCI XI ARM (DISPOSABLE) ×12
DRAPE DA VINCI XI COLUMN (DISPOSABLE) ×3
DRAPE SURG IRRIG POUCH 19X23 (DRAPES) ×1 IMPLANT
DRSG OPSITE POSTOP 4X10 (GAUZE/BANDAGES/DRESSINGS) IMPLANT
DRSG OPSITE POSTOP 4X6 (GAUZE/BANDAGES/DRESSINGS) ×2 IMPLANT
DRSG OPSITE POSTOP 4X8 (GAUZE/BANDAGES/DRESSINGS) IMPLANT
DRSG TEGADERM 2-3/8X2-3/4 SM (GAUZE/BANDAGES/DRESSINGS) ×15 IMPLANT
DRSG TEGADERM 4X4.75 (GAUZE/BANDAGES/DRESSINGS) IMPLANT
ELECT PENCIL ROCKER SW 15FT (MISCELLANEOUS) ×1 IMPLANT
ELECT REM PT RETURN 15FT ADLT (MISCELLANEOUS) ×3 IMPLANT
ENDOLOOP SUT PDS II  0 18 (SUTURE)
ENDOLOOP SUT PDS II 0 18 (SUTURE) IMPLANT
EVACUATOR SILICONE 100CC (DRAIN) IMPLANT
GAUZE SPONGE 2X2 8PLY STRL LF (GAUZE/BANDAGES/DRESSINGS) ×1 IMPLANT
GLOVE SURG NEOPR MICRO LF SZ8 (GLOVE) ×9 IMPLANT
GLOVE SURG UNDER LTX SZ8 (GLOVE) ×9 IMPLANT
GOWN STRL REUS W/TWL XL LVL3 (GOWN DISPOSABLE) ×9 IMPLANT
GRASPER SUT TROCAR 14GX15 (MISCELLANEOUS) IMPLANT
HOLDER FOLEY CATH W/STRAP (MISCELLANEOUS) ×3 IMPLANT
IRRIG SUCT STRYKERFLOW 2 WTIP (MISCELLANEOUS) ×3
IRRIGATION SUCT STRKRFLW 2 WTP (MISCELLANEOUS) ×1 IMPLANT
KIT PROCEDURE DA VINCI SI (MISCELLANEOUS)
KIT PROCEDURE DVNC SI (MISCELLANEOUS) IMPLANT
KIT SIGMOIDOSCOPE (SET/KITS/TRAYS/PACK) IMPLANT
KIT TURNOVER KIT A (KITS) ×2 IMPLANT
NDL INSUFFLATION 14GA 120MM (NEEDLE) ×1 IMPLANT
NEEDLE INSUFFLATION 14GA 120MM (NEEDLE) ×3 IMPLANT
PACK CARDIOVASCULAR III (CUSTOM PROCEDURE TRAY) ×3 IMPLANT
PACK COLON (CUSTOM PROCEDURE TRAY) ×3 IMPLANT
PAD POSITIONING PINK XL (MISCELLANEOUS) ×3 IMPLANT
PROTECTOR NERVE ULNAR (MISCELLANEOUS) ×6 IMPLANT
RELOAD STAPLE 45 3.5 BLU DVNC (STAPLE) IMPLANT
RELOAD STAPLE 45 4.3 GRN DVNC (STAPLE) IMPLANT
RELOAD STAPLE 60 2.5 WHT DVNC (STAPLE) IMPLANT
RELOAD STAPLE 60 3.5 BLU DVNC (STAPLE) IMPLANT
RELOAD STAPLE 60 4.3 GRN DVNC (STAPLE) IMPLANT
RELOAD STAPLER 2.5X60 WHT DVNC (STAPLE) ×3 IMPLANT
RELOAD STAPLER 3.5X45 BLU DVNC (STAPLE) IMPLANT
RELOAD STAPLER 3.5X60 BLU DVNC (STAPLE) ×2 IMPLANT
RELOAD STAPLER 4.3X45 GRN DVNC (STAPLE) IMPLANT
RELOAD STAPLER 4.3X60 GRN DVNC (STAPLE) IMPLANT
RETRACTOR WND ALEXIS 18 MED (MISCELLANEOUS) IMPLANT
RTRCTR WOUND ALEXIS 18CM MED (MISCELLANEOUS)
SCISSORS LAP 5X35 DISP (ENDOMECHANICALS) ×3 IMPLANT
SEAL CANN UNIV 5-8 DVNC XI (MISCELLANEOUS) ×3 IMPLANT
SEAL XI 5MM-8MM UNIVERSAL (MISCELLANEOUS) ×9
SEALER VESSEL DA VINCI XI (MISCELLANEOUS) ×3
SEALER VESSEL EXT DVNC XI (MISCELLANEOUS) ×1 IMPLANT
SOLUTION ELECTROLUBE (MISCELLANEOUS) ×3 IMPLANT
SPONGE GAUZE 2X2 STER 10/PKG (GAUZE/BANDAGES/DRESSINGS) ×2
STAPLER 45 DA VINCI SURE FORM (STAPLE)
STAPLER 45 SUREFORM DVNC (STAPLE) IMPLANT
STAPLER 60 DA VINCI SURE FORM (STAPLE) ×3
STAPLER 60 SUREFORM DVNC (STAPLE) IMPLANT
STAPLER CANNULA SEAL DVNC XI (STAPLE) ×1 IMPLANT
STAPLER CANNULA SEAL XI (STAPLE) ×3
STAPLER ECHELON POWER CIR 29 (STAPLE) IMPLANT
STAPLER ECHELON POWER CIR 31 (STAPLE) IMPLANT
STAPLER RELOAD 2.5X60 WHITE (STAPLE) ×9
STAPLER RELOAD 2.5X60 WHT DVNC (STAPLE) ×3
STAPLER RELOAD 3.5X45 BLU DVNC (STAPLE)
STAPLER RELOAD 3.5X45 BLUE (STAPLE)
STAPLER RELOAD 3.5X60 BLU DVNC (STAPLE) ×2
STAPLER RELOAD 3.5X60 BLUE (STAPLE) ×6
STAPLER RELOAD 4.3X45 GREEN (STAPLE)
STAPLER RELOAD 4.3X45 GRN DVNC (STAPLE)
STAPLER RELOAD 4.3X60 GREEN (STAPLE)
STAPLER RELOAD 4.3X60 GRN DVNC (STAPLE)
STOPCOCK 4 WAY LG BORE MALE ST (IV SETS) ×6 IMPLANT
SURGILUBE 2OZ TUBE FLIPTOP (MISCELLANEOUS) IMPLANT
SUT MNCRL AB 4-0 PS2 18 (SUTURE) ×5 IMPLANT
SUT PDS AB 1 CT1 27 (SUTURE) ×6 IMPLANT
SUT PROLENE 0 CT 2 (SUTURE) IMPLANT
SUT PROLENE 2 0 KS (SUTURE) IMPLANT
SUT PROLENE 2 0 SH DA (SUTURE) IMPLANT
SUT SILK 2 0 (SUTURE)
SUT SILK 2 0 SH CR/8 (SUTURE) IMPLANT
SUT SILK 2-0 18XBRD TIE 12 (SUTURE) IMPLANT
SUT SILK 3 0 (SUTURE)
SUT SILK 3 0 SH CR/8 (SUTURE) ×3 IMPLANT
SUT SILK 3-0 18XBRD TIE 12 (SUTURE) IMPLANT
SUT V-LOC BARB 180 2/0GR6 GS22 (SUTURE)
SUT VIC AB 3-0 SH 18 (SUTURE) IMPLANT
SUT VIC AB 3-0 SH 27 (SUTURE)
SUT VIC AB 3-0 SH 27XBRD (SUTURE) IMPLANT
SUT VICRYL 0 UR6 27IN ABS (SUTURE) ×3 IMPLANT
SUTURE V-LC BRB 180 2/0GR6GS22 (SUTURE) IMPLANT
SYR 10ML ECCENTRIC (SYRINGE) ×3 IMPLANT
SYS LAPSCP GELPORT 120MM (MISCELLANEOUS)
SYS WOUND ALEXIS 18CM MED (MISCELLANEOUS) ×3
SYSTEM LAPSCP GELPORT 120MM (MISCELLANEOUS) IMPLANT
SYSTEM WOUND ALEXIS 18CM MED (MISCELLANEOUS) ×1 IMPLANT
TOWEL OR NON WOVEN STRL DISP B (DISPOSABLE) ×3 IMPLANT
TRAY FOLEY MTR SLVR 16FR STAT (SET/KITS/TRAYS/PACK) ×3 IMPLANT
TROCAR ADV FIXATION 5X100MM (TROCAR) ×3 IMPLANT
TUBING CONNECTING 10 (TUBING) ×3 IMPLANT
TUBING CONNECTING 10' (TUBING) ×1
TUBING INSUFFLATION 10FT LAP (TUBING) ×3 IMPLANT

## 2021-10-12 NOTE — Interval H&P Note (Signed)
History and Physical Interval Note:  10/12/2021 9:52 AM  Carolyn Johns  has presented today for surgery, with the diagnosis of colon polyp.  The various methods of treatment have been discussed with the patient and family. After consideration of risks, benefits and other options for treatment, the patient has consented to  Procedure(s): ROBOTIC COLECTOMY PROXIMAL (N/A) as a surgical intervention.  The patient's history has been reviewed, patient examined, no change in status, stable for surgery.  I have reviewed the patient's chart and labs.  Questions were answered to the patient's satisfaction.    I have re-reviewed the the patient's records, history, medications, and allergies.  I have re-examined the patient.  I again discussed intraoperative plans and goals of post-operative recovery.  The patient agrees to proceed.  Carolyn Johns  1956/02/07 [id number]  Patient Care Team: Leonard Downing, MD as PCP - General (Family Medicine) Im Boston, MD as Consulting Physician (General Surgery) Arta Silence, MD as Consulting Physician (Gastroenterology)  Patient Active Problem List   Diagnosis Date Noted   Hypothyroidism 01/15/2018   UTI (urinary tract infection) 01/15/2018   Elevated troponin 01/15/2018   Near syncope 01/15/2018   Tobacco abuse 01/15/2018   Pulmonary nodules 01/15/2018    Past Medical History:  Diagnosis Date   COPD (chronic obstructive pulmonary disease) (HCC)    Elevated liver enzymes    GERD (gastroesophageal reflux disease)    Pneumonia    Thyroid disease    Tobacco abuse     Past Surgical History:  Procedure Laterality Date   CYST REMOVAL HAND     WISDOM TOOTH EXTRACTION      Social History   Socioeconomic History   Marital status: Legally Separated    Spouse name: Not on file   Number of children: Not on file   Years of education: Not on file   Highest education level: Not on file  Occupational History   Not on file  Tobacco Use    Smoking status: Some Days    Packs/day: 0.25    Types: Cigarettes   Smokeless tobacco: Never  Vaping Use   Vaping Use: Never used  Substance and Sexual Activity   Alcohol use: Yes    Comment: rare   Drug use: No   Sexual activity: Not on file  Other Topics Concern   Not on file  Social History Narrative   Not on file   Social Determinants of Health   Financial Resource Strain: Not on file  Food Insecurity: Not on file  Transportation Needs: Not on file  Physical Activity: Not on file  Stress: Not on file  Social Connections: Not on file  Intimate Partner Violence: Not on file    Family History  Problem Relation Age of Onset   Stroke Father    Seizures Father     Medications Prior to Admission  Medication Sig Dispense Refill Last Dose   levothyroxine (SYNTHROID, LEVOTHROID) 100 MCG tablet Take 100 mcg by mouth daily before breakfast.  1 10/11/2021   Melatonin 5 MG TABS Take 10 mg by mouth at bedtime as needed (sleep).   Past Month    Current Facility-Administered Medications  Medication Dose Route Frequency Provider Last Rate Last Admin   bupivacaine liposome (EXPAREL) 1.3 % injection 266 mg  20 mL Infiltration Once Knoke Boston, MD       cefoTEtan (CEFOTAN) 2 g in sodium chloride 0.9 % 100 mL IVPB  2 g Intravenous On Call to [place], [place],  MD       feeding supplement (ENSURE PRE-SURGERY) liquid 592 mL  592 mL Oral Once Allcorn Boston, MD       lactated ringers infusion   Intravenous Continuous Janeece Riggers, MD 10 mL/hr at 10/12/21 0837 Restarted at 10/12/21 0900     Allergies  Allergen Reactions   Codeine     GI upset    BP (!) 121/58   Pulse 94   Temp 98 F (36.7 C) (Oral)   Resp 16   SpO2 98%   Labs: No results found for this or any previous visit (from the past 48 hour(s)).  Imaging / Studies: No results found.   Adin Hector, M.D., F.A.C.S. Gastrointestinal and Minimally Invasive Surgery [facility] [address]  [place], [place] [address] [phone] Main / Paging  10/12/2021 9:52 AM    Adin Hector

## 2021-10-12 NOTE — H&P (Signed)
10/12/2021    REFERRING PHYSICIAN: Arta Silence, MD  Patient Care Team: Vivi Barrack, MD as PCP - General (Family Medicine) Makina Skow, Adrian Saran, MD as Consulting Provider (General Surgery) Arta Silence, MD (Gastroenterology)  PROVIDER: Hollace Kinnier, MD  [facility] MRN: [id number] DOB: [birth year] 10/12/2021   Subjective   Chief Complaint: No chief complaint on file. Large polyp of ascending colon not amenable to endoscopic resection. Consider surgery  History of Present Illness: Carolyn Johns is a 65 y.o. female who is seen today as an office consultation at the request of Dr. Paulita Fujita for evaluation of colon polyp[ .   Woman with positive fecal occult blood test but no major hematochezia. I believe was post to go see gastroenterology. That did not happen. She returned a year later to found that that had not happened. Noted some occasional bright red bleeding. She thought it was most likely hemorrhoids and not a concern. However primary care physician was concerned. Patient sent to [facility] gastroenterology. Underwent upper and lower endoscopy. Found to have proximal ascending colon mass not amenable to complete endoscopic resection by Dr Paulita Fujita. Biopsy showing adenomatous polyp. Options discussed about possible Endomucosal EMR resection versus partial colectomy. She wished to discuss with surgery.  Patient currently history of hypothyroidism and some COPD with occasional inhaler use. Does smoke. Did have an episode of dizziness and chest issues for which she was admitted in 2019. Had a Myoview study that showed no ST elevation with normal ejection fraction. Low risk study. Recommend he quit smoking and follow-up with cardiology Dr. Ellouise Newer if needed. That does not look like it happened that she felt well. She has since cut back to just a couple cigarettes a week. She can walk a couple miles without difficulty. 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 Only needed  her inhaler if she was wearing a mask all day. Has not needed it in a while. There was concern of some lung nodules on a study many years ago but follow-up CT of chest 2019 noted it resolved. Minimal emphysema noted.   Medical History: Past Medical History:  Diagnosis Date   COPD (chronic obstructive pulmonary disease) (CMS-HCC)   Thyroid disease   There is no problem list on file for this patient.  Past Surgical History:  Procedure Laterality Date   COLON SURGERY    No Known Allergies  Current Outpatient Medications on File Prior to Visit  Medication Sig Dispense Refill   levothyroxine (SYNTHROID) 100 MCG tablet Take 100 mcg by mouth once daily   No current facility-administered medications on file prior to visit.   Family History  Family history unknown: Yes    Social History   Tobacco Use  Smoking Status Former Smoker  Smokeless Tobacco Never Used    Social History   Socioeconomic History   Marital status: Legally Separated  Tobacco Use   Smoking status: Former Smoker   Smokeless tobacco: Never Used  Substance and Sexual Activity   Alcohol use: Never   Drug use: Never   ############################################################  Fraility Risk:  Preoperative Risks/Screening 1. Frailty Review:  Lives independently? yes Uses a mobility assist device (cane/walker/wheelchair)? no History of falls within 3 months? no Cognitive impairment/dementia? no Age > 65? no  2. Nutrition Screening: Cancer/IBD? no Age >65? no Weight loss >10% in past 6 months? no If yes to any of the 3 above, consider Impact AR supplemental shake  3. PONV Screening: History of PONV? no Female  under age of 74? no History of motion sickness? no  4. Chronic pain issues? no  5. Diabetes? no Last HgbA1c: No results found for: HGBA1C  Review of Systems: A complete review of systems (ROS) was obtained from the patient. I have reviewed this information and discussed as appropriate  with the patient. See HPI as well for other pertinent ROS.  Constitutional: No fevers, chills, sweats. Weight stable Eyes: No vision changes, No discharge HENT: No sore throats, nasal drainage Lymph: No neck swelling, No bruising easily Pulmonary: No cough, productive sputum CV: No orthopnea, PND Patient walks 60 minutes for about 2 miles without difficulty. No exertional chest/neck/shoulder/arm pain.  GI: No personal nor family history of GI/colon cancer, inflammatory bowel disease, irritable bowel syndrome, allergy such as Celiac Sprue, dietary/dairy problems, colitis, ulcers nor gastritis. No recent sick contacts/gastroenteritis. No travel outside the country. No changes in diet.  Renal: No UTIs, No hematuria Genital: No drainage, bleeding, masses Musculoskeletal: No severe joint pain. Good ROM major joints Skin: No sores or lesions Heme/Lymph: No easy bleeding. No swollen lymph nodes  Objective:   Vitals:  09/03/21 0928  BP: 128/72  Pulse: 95  Temp: 36.9 C (98.5 F)  SpO2: 99%  Weight: 58.2 kg (128 lb 6.4 oz)  Height: 160 cm (5\' 3" )    Body mass index is 22.75 kg/m.  PHYSICAL EXAM:  Constitutional: Not cachectic. Hygeine adequate. Vitals signs as above.  Eyes: Pupils reactive, normal extraocular movements. Sclera nonicteric Neuro: CN II-XII intact. No major focal sensory defects. No major motor deficits. Lymph: No head/neck/groin lymphadenopathy Psych: No severe agitation. No severe anxiety. Judgment & insight Adequate, Oriented x4, HENT: Normocephalic, Mucus membranes moist. No thrush.  Neck: Supple, No tracheal deviation. No obvious thyromegaly Chest: No pain to chest wall compression. Good respiratory excursion. No audible wheezing CV: Pulses intact. Regular rhythm. No major extremity edema  Abdomen: Flat Hernia: Not present. Diastasis recti: Not present. Soft. Nondistended. Nontender. No hepatomegaly. No splenomegaly  Gen: Inguinal hernia: Not present. Inguinal  lymph nodes: without lymphadenopathy.   Rectal: (Deferred)  Ext: No obvious deformity or contracture. Edema: Not present. No cyanosis Skin: No major subcutaneous nodules. Warm and dry Musculoskeletal: Severe joint rigidity not present. No obvious clubbing. No digital petechiae.   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 No definite high-grade dysplasia.  Assessment and Plan:  DIAGNOSES:  Diagnoses and all orders for this visit:  Polyp of ascending colon, unspecified type  Tobacco abuse counseling  History of rectal bleeding    ASSESSMENT/PLAN  Pleasant woman with positive fecal blood test and occasional bright red bleeding with polyps. Largest at ascending colon not amenable to endoscopic resection.  Think she would benefit from segmental resection. Reasonable candidate for robotic approach.   The anatomy & physiology of the digestive tract was discussed. The pathophysiology of the colon was discussed. Natural history risks without surgery was discussed. I feel the risks of no intervention will lead to serious problems that outweigh the operative risks; therefore, I recommended a partial colectomy to remove the pathology. Minimally invasive (Robotic/Laparoscopic) & open techniques were discussed.   Risks such as bleeding, infection, abscess, leak, reoperation, injury to other organs, need for repair of tissues / organs, possible ostomy, hernia, heart attack, stroke, death, and other risks were discussed. I noted a good  likelihood this will help address the problem. Goals of post-operative recovery were discussed as well. Need for adequate nutrition, daily bowel regimen and healthy physical activity, to optimize recovery was noted as well. We will work to minimize complications.  Educational materials were available as well. Questions were answered. The patient expresses understanding & wishes to proceed with surgery.   Despite her diagnosis of COPD and smoking, they are both rather mild. She barely smokes now and has great exercise tolerance. Not oxygen dependent. Only needed inhaler when she was wearing a mask all day and does not need to do that as much anymore she had echocardiogram and stress test done a couple years ago that were all underwhelming. She has great exercise tolerance. Do not feel strongly we need to do anything more aggressive at this time and she agrees.

## 2021-10-12 NOTE — Anesthesia Preprocedure Evaluation (Addendum)
Anesthesia Evaluation  Patient identified by MRN, date of birth, ID band Patient awake    Reviewed: Allergy & Precautions, NPO status , Patient's Chart, lab work & pertinent test results  History of Anesthesia Complications Negative for: history of anesthetic complications  Airway Mallampati: I  TM Distance: >3 FB Neck ROM: Full    Dental  (+) Edentulous Upper, Edentulous Lower   Pulmonary COPD,  COPD inhaler, Current Smoker and Patient abstained from smoking.,  10/10/2021 SARS coronavirus NEG   breath sounds clear to auscultation       Cardiovascular negative cardio ROS   Rhythm:Regular Rate:Normal  '19 stress:  no ST segment deviation noted during stress, No T wave inversion was noted during stress. EF: 59%. The study is normal. This is a low risk study.  '19 ECHO: Systolic function was normal. EF 55-60%. Wall motion was normal, no regional wall motion abnormalities. grade 1 DD, mild MR   Neuro/Psych negative neurological ROS     GI/Hepatic Neg liver ROS, GERD  Controlled and Medicated,  Endo/Other  Hypothyroidism   Renal/GU negative Renal ROS     Musculoskeletal   Abdominal   Peds  Hematology negative hematology ROS (+)   Anesthesia Other Findings   Reproductive/Obstetrics                           Anesthesia Physical Anesthesia Plan  ASA: 2  Anesthesia Plan: General   Post-op Pain Management: Tylenol PO (pre-op) and Regional block   Induction:   PONV Risk Score and Plan:   Airway Management Planned: Oral ETT  Additional Equipment: None  Intra-op Plan:   Post-operative Plan: Extubation in OR  Informed Consent: I have reviewed the patients History and Physical, chart, labs and discussed the procedure including the risks, benefits and alternatives for the proposed anesthesia with the patient or authorized representative who has indicated his/her understanding and  acceptance.     Dental advisory given  Plan Discussed with: Surgeon and CRNA  Anesthesia Plan Comments:        Anesthesia Quick Evaluation

## 2021-10-12 NOTE — Anesthesia Postprocedure Evaluation (Signed)
Anesthesia Post Note  Patient: ANIYAH NOBIS  Procedure(s) Performed: ROBOTIC RIGHT PROXIMAL COLECTOMY WITH BILATERAL TAP BLOCK (Abdomen)     Patient location during evaluation: PACU Anesthesia Type: General Level of consciousness: awake and alert, patient cooperative and oriented Pain management: pain level controlled Vital Signs Assessment: post-procedure vital signs reviewed and stable Respiratory status: spontaneous breathing, nonlabored ventilation and respiratory function stable Cardiovascular status: blood pressure returned to baseline and stable Postop Assessment: no apparent nausea or vomiting Anesthetic complications: no   No notable events documented.  Last Vitals:  Vitals:   10/12/21 1345 10/12/21 1442  BP: 133/70 (!) 86/61  Pulse: 69 74  Resp: 19 16  Temp:  36.5 C  SpO2: 94% 100%    Last Pain:  Vitals:   10/12/21 1442  TempSrc: Oral  PainSc:                  Yzabella Crunk,E. Verdelle Valtierra

## 2021-10-12 NOTE — Op Note (Signed)
10/12/2021  1:13 PM  PATIENT:  Carolyn Johns  65 y.o. female  Patient Care Team: Leonard Downing, MD as PCP - General (Family Medicine) Lahmann Boston, MD as Consulting Physician (General Surgery) Arta Silence, MD as Consulting Physician (Gastroenterology)  PRE-OPERATIVE DIAGNOSIS:  ASCENDING COLON POLYP NOT RESECTABLE BY COLONOSCOPY  POST-OPERATIVE DIAGNOSIS:  ASCENDING COLON POLYP NOT RESECTABLE BY COLONOSCOPY  PROCEDURE:   ROBOTIC PROXIMAL COLECTOMY  TRANSVERSUS ABDOMINIS PLANE (TAP) BLOCK - BILATERAL  SURGEON:  Adin Hector, MD  ASSISTANT: Leighton Ruff, MD, FACS, FASCRS An experienced assistant was required given the standard of surgical care given the complexity of the case.  This assistant was needed for exposure, dissection, suction, tissue approximation, retraction, perception, etc.  ANESTHESIA:     General  Nerve block provided with liposomal bupivacaine (Experel) mixed with 0.25% bupivacaine as a Bilateral TAP block x 61mL each side at the level of the transverse abdominis & preperitoneal spaces along the flank at the anterior axillary line, from subcostal ridge to iliac crest under laparoscopic guidance   Local field block at port sites & extraction wound  EBL:  Total I/O In: 1400 [I.V.:1300; IV Piggyback:100] Out: 150 [Urine:100; Blood:50]  Delay start of Pharmacological VTE agent (>24hrs) due to surgical blood loss or risk of bleeding:  no  DRAINS: none   SPECIMEN:  PROXIMAL "RIGHT" COLON  DISPOSITION OF SPECIMEN:  PATHOLOGY  COUNTS:  YES  PLAN OF CARE: Admit to inpatient   PATIENT DISPOSITION:  PACU - hemodynamically stable.  INDICATION:    Pleasant woman with positive fecal occult blood test.  Colonoscopy revealed firm polyp in the proximal colon presumed to be ascending colon not amenable to safely removed by colonoscopy.  Surgical consultation requested.  I recommended segmental resection:  The anatomy & physiology of the digestive  tract was discussed.  The pathophysiology was discussed.  Natural history risks without surgery was discussed.   I worked to give an overview of the disease and the frequent need to have multispecialty involvement.  I feel the risks of no intervention will lead to serious problems that outweigh the operative risks; therefore, I recommended a partial colectomy to remove the pathology.  Laparoscopic & open techniques were discussed.   Risks such as bleeding, infection, abscess, leak, reoperation, possible ostomy, hernia, heart attack, death, and other risks were discussed.  I noted a good likelihood this will help address the problem.   Goals of post-operative recovery were discussed as well.  We will work to minimize complications.  An educational handout on the pathology was given as well.  Questions were answered.    The patient expresses understanding & wishes to proceed with surgery.  OR FINDINGS:   Patient had 3 cm firm polyp at proximal ascending colon. Enlarged but ellipsoid lymph node on ileocecal pedicle obvious  No obvious metastatic disease on visceral parietal peritoneum or liver.  It is an isoperistaltic ileocolonic anastomosis that rests in the hepatic flexure.  CASE DATA:  Type of patient?: Elective [facility] Private Case  Status of Case? Elective Scheduled  Infection Present At Time Of Surgery (PATOS)?  NO    DESCRIPTION:   Informed consent was confirmed.  The patient underwent general anaesthesia without difficulty.  The patient was positioned with arms tucked & secured appropriately.  VTE prevention in place.  The patient's abdomen was clipped, prepped, & draped in a sterile fashion.  Surgical timeout confirmed our plan.  The patient was positioned in reverse Trendelenburg.  Abdominal entry was  gained using Varess technique at the left subcostal ridge on the anterior abdominal wall.  No elevated EtCO2 noted.  Port placed.  Camera inspection revealed no injury.  Extra ports were  carefully placed under direct laparoscopic visualization.  Diagnostic laparoscopy revealed no tattooing.  Inspection raise suspicion of probable mass on the ascending colon.  I reviewed the records.  Cannot find a colonoscopy report but my review of it in the office noted that is where the location was.  We docked the [employer]uitive Vinci robot carefully and placed intstruments under visualization  I mobilized & reflected the greater omentum and small bowel into the lower abdomen.  We decided to mobilize the right colon.  I freed the transverse colon off the greater omentum towards the hepatic flexure in a superior to inferior fashion.  I found the ileocecal mesentery and freed it off the right lower quadrant retroperitoneum.  Freed off the ascending colon off the line of Toldt.  Gradually freed the right colon off the retroperitoneum right kidney and duodenal sweep.  Good fine no tattooing.  However we had excellent mobility.  We created a extraction incision through a small Pfannenstiel incision and placed a wound protector.  It was able to be eviscerate much of the cecum and ascending colon.  I could palpate a small but firm mass in the proximal ascending colon.  Dr. Marcello Moores agreed.  Because I had not taken the pedicle and the hepatic flexure cannot completely come up, I decided to resume robotic intracorporeal colectomy.  We returned the colon into the abdomen with a cap on the wound protector and read docked the robot.  I was able to elevate the proximal colon to isolate the ileocolonic pedicle.  I scored the ileal mesentery just proximal to that.   I carried that further dissection in a medial to lateral fashion.  I was able to bluntly get into the retro-mesenteric plane on the right side.  I completed retroperitoneal dissection to make sure the entire right sided colonic mesentery off the retroperitoneum including the duodenal sweep, pancreatic head, & Gerota's fascia of the right kidney. I was able to get  underneath the hepatic flexure.  I was able to get underneath the proximal and mid transverse colon.  I isolated the proximal ileocecal pedicle.  I skeletonized it & transected the vessels.    I could isolate the pathology. Chose an area in the distal ileum and transected the mesentery radially chose an area in the transverse colon just proximal to a dominant middle colic artery pedicle and transected that in a radial fashion to good location.  We confirmed good viability of the ileum and transverse colon plan for anastomosis. When he went ahead and proceeded with transection.  We transected at the distal ileum with a robotic stapler 99mm blue load.  We then transected transverse colon with a robotic stapler 31mm blue load.  We confirmed hemostasis.     I did a side-to-side stapled anastomosis of ileum to mid-transverse colon using a 50mm white load in an isoperistaltic fashion.  (Distal stump of ileum to mid transverse colon for the distal end of the anastomosis.  Proximal end of colon stump to more proximal ileum for the proximal end of the anastomosis).  I sewed the common staple channel wound with an absorbable suture (V-lock) in a running Plain Dealing fashion from each corner and meeting in the center.  I did meticulous inspection prove an airtight closure.  I protected the anastomosis line with an anterior  omentopexy of greater omentum using V lock suture.    We did reinspection of the abdomen.  Hemostasis was good.   Ureters, retroperitoneum, and bowel uninjured.  The anastomosis looked healthy.   Endoluminal gas was evacuated.  Specimen removed without incident.  I opened up the colon on the back table and confirmed a fibrotic polyp mass in the proximal ascending colon.  I scrubbed back in  Ports & wound protector removed.  Hemostasis was good.  Sterile unused instruments were used from this point.  I closed the skin at the port sites using Monocryl stitch and sterile dressing.  I closed the extraction  wound using a 0 Vicryl vertical peritoneal closure and a #1 PDS transverse anterior rectal fascial closure like a small Pfannenstiel closure. I closed the skin with some interrupted Monocryl stitches.  I placed sterile dressings.     Patient is being extubated go to recovery room. I discussed postop care with the patient in detail the office & in the holding area. Instructions are written. I discussed operative findings, updated the patient's status, discussed probable steps to recovery, and gave postoperative recommendations to the patient's spouse, Robinette Esters.  Recommendations were made.  Questions were answered.  He expressed understanding & appreciation.   Adin Hector, M.D., F.A.C.S. Gastrointestinal and Minimally Invasive Surgery [facility] [address], [place], [place] [address] [phone] Main / Paging

## 2021-10-12 NOTE — Discharge Instructions (Signed)
SURGERY: POST OP INSTRUCTIONS (Surgery for small bowel obstruction, colon resection, etc)   ######################################################################  EAT Gradually transition to a high fiber diet with a fiber supplement over the next few days after discharge  WALK Walk an hour a day.  Control your pain to do that.    CONTROL PAIN Control pain so that you can walk, sleep, tolerate sneezing/coughing, go up/down stairs.  HAVE A BOWEL MOVEMENT DAILY Keep your bowels regular to avoid problems.  OK to try a laxative to override constipation.  OK to use an antidairrheal to slow down diarrhea.  Call if not better after 2 tries  CALL IF YOU HAVE PROBLEMS/CONCERNS Call if you are still struggling despite following these instructions. Call if you have concerns not answered by these instructions  ######################################################################   DIET Follow a light diet the first few days at home.  Start with a bland diet such as soups, liquids, starchy foods, low fat foods, etc.  If you feel full, bloated, or constipated, stay on a ful liquid or pureed/blenderized diet for a few days until you feel better and no longer constipated. Be sure to drink plenty of fluids every day to avoid getting dehydrated (feeling dizzy, not urinating, etc.). Gradually add a fiber supplement to your diet over the next week.  Gradually get back to a regular solid diet.  Avoid fast food or heavy meals the first week as you are more likely to get nauseated. It is expected for your digestive tract to need a few months to get back to normal.  It is common for your bowel movements and stools to be irregular.  You will have occasional bloating and cramping that should eventually fade away.  Until you are eating solid food normally, off all pain medications, and back to regular activities; your bowels will not be normal. Focus on eating a low-fat, high fiber diet the rest of your life  (See Getting to [place], below).  CARE of your INCISION or WOUND It is good for closed incision and even open wounds to be washed every day.  Shower every day.  Short baths are fine.  Wash the incisions and wounds clean with soap & water.     If you have a closed incision(s), wash the incision with soap & water every day.  You may leave closed incisions open to air if it is dry.   You may cover the incision with clean gauze & replace it after your daily shower for comfort.  It is good for closed incisions and even open wounds to be washed every day.  Shower every day.  Short baths are fine.  Wash the incisions and wounds clean with soap & water.    You may leave closed incisions open to air if it is dry.   You may cover the incision with clean gauze & replace it after your daily shower for comfort.  TEGADERM:  You have clear gauze band-aid dressings over your closed incision(s).  Remove the dressings 3 days after surgery. = 10/15/2021   If you have an open wound with a wound vac, see wound vac care instructions.     ACTIVITIES as tolerated Start light daily activities --- self-care, walking, climbing stairs-- beginning the day after surgery.  Gradually increase activities as tolerated.  Control your pain to be active.  Stop when you are tired.  Ideally, walk several times a day, eventually an hour a day.   Most people are back to most day-to-day  activities in a few weeks.  It takes 4-8 weeks to get back to unrestricted, intense activity. If you can walk 30 minutes without difficulty, it is safe to try more intense activity such as jogging, treadmill, bicycling, low-impact aerobics, swimming, etc. Save the most intensive and strenuous activity for last (Usually 4-8 weeks after surgery) such as sit-ups, heavy lifting, contact sports, etc.  Refrain from any intense heavy lifting or straining until you are off narcotics for pain control.  You will have off days, but things should improve  week-by-week. DO NOT PUSH THROUGH PAIN.  Let pain be your guide: If it hurts to do something, don't do it.  Pain is your body warning you to avoid that activity for another week until the pain goes down. You may drive when you are no longer taking narcotic prescription pain medication, you can comfortably wear a seatbelt, and you can safely make sudden turns/stops to protect yourself without hesitating due to pain. You may have sexual intercourse when it is comfortable. If it hurts to do something, stop.  MEDICATIONS Take your usually prescribed home medications unless otherwise directed.   Blood thinners:  Usually you can restart any strong blood thinners after the second postoperative day.  It is OK to take aspirin right away.     If you are on strong blood thinners (warfarin/Coumadin, Plavix, Xerelto, Eliquis, Pradaxa, etc), discuss with your surgeon, medicine PCP, and/or cardiologist for instructions on when to restart the blood thinner & if blood monitoring is needed (PT/INR blood check, etc).     PAIN CONTROL Pain after surgery or related to activity is often due to strain/injury to muscle, tendon, nerves and/or incisions.  This pain is usually short-term and will improve in a few months.  To help speed the process of healing and to get back to regular activity more quickly, DO THE FOLLOWING THINGS TOGETHER: Increase activity gradually.  DO NOT PUSH THROUGH PAIN Use Ice and/or Heat Try Gentle Massage and/or Stretching Take over the counter pain medication Take Narcotic prescription pain medication for more severe pain  Good pain control = faster recovery.  It is better to take more medicine to be more active than to stay in bed all day to avoid medications.  Increase activity gradually Avoid heavy lifting at first, then increase to lifting as tolerated over the next 6 weeks. Do not "push through" the pain.  Listen to your body and avoid positions and maneuvers than reproduce the pain.   Wait a few days before trying something more intense Walking an hour a day is encouraged to help your body recover faster and more safely.  Start slowly and stop when getting sore.  If you can walk 30 minutes without stopping or pain, you can try more intense activity (running, jogging, aerobics, cycling, swimming, treadmill, sex, sports, weightlifting, etc.) Remember: If it hurts to do it, then don't do it! Use Ice and/or Heat You will have swelling and bruising around the incisions.  This will take several weeks to resolve. Ice packs or heating pads (6-8 times a day, 30-60 minutes at a time) will help sooth soreness & bruising. Some people prefer to use ice alone, heat alone, or alternate between ice & heat.  Experiment and see what works best for you.  Consider trying ice for the first few days to help decrease swelling and bruising; then, switch to heat to help relax sore spots and speed recovery. Shower every day.  Short baths are fine.  It  feels good!  Keep the incisions and wounds clean with soap & water.   Try Gentle Massage and/or Stretching Massage at the area of pain many times a day Stop if you feel pain - do not overdo it Take over the counter pain medication This helps the muscle and nerve tissues become less irritable and calm down faster Choose ONE of the following over-the-counter anti-inflammatory medications: Acetaminophen 500mg  tabs (Tylenol) 1-2 pills with every meal and just before bedtime (avoid if you have liver problems or if you have acetaminophen in you narcotic prescription) Naproxen 220mg  tabs (ex. Aleve, Naprosyn) 1-2 pills twice a day (avoid if you have kidney, stomach, IBD, or bleeding problems) Ibuprofen 200mg  tabs (ex. Advil, Motrin) 3-4 pills with every meal and just before bedtime (avoid if you have kidney, stomach, IBD, or bleeding problems) Take with food/snack several times a day as directed for at least 2 weeks to help keep pain / soreness down & more  manageable. Take Narcotic prescription pain medication for more severe pain A prescription for strong pain control is often given to you upon discharge (for example: oxycodone/Percocet, hydrocodone/Norco/Vicodin, or tramadol/Ultram) Take your pain medication as prescribed. Be mindful that most narcotic prescriptions contain Tylenol (acetaminophen) as well - avoid taking too much Tylenol. If you are having problems/concerns with the prescription medicine (does not control pain, nausea, vomiting, rash, itching, etc.), please call us [phone] to see if we need to switch you to a different pain medicine that will work better for you and/or control your side effects better. If you need a refill on your pain medication, you must call the office before 4 pm and on weekdays only.  By federal law, prescriptions for narcotics cannot be called into a pharmacy.  They must be filled out on paper & picked up from our office by the patient or authorized caretaker.  Prescriptions cannot be filled after 4 pm nor on weekends.    WHEN TO CALL us [phone] Severe uncontrolled or worsening pain  Fever over 101 F (38.5 C) Concerns with the incision: Worsening pain, redness, rash/hives, swelling, bleeding, or drainage Reactions / problems with new medications (itching, rash, hives, nausea, etc.) Nausea and/or vomiting Difficulty urinating Difficulty breathing Worsening fatigue, dizziness, lightheadedness, blurred vision Other concerns If you are not getting better after two weeks or are noticing you are getting worse, contact our office [phone] for further advice.  We may need to adjust your medications, re-evaluate you in the office, send you to the emergency room, or see what other things we can do to help. The clinic staff is available to answer your questions during regular business hours (8:30am-5pm).  Please don't hesitate to call and ask to speak to one of our nurses for clinical concerns.    A  surgeon from [facility] is always on call at the hospitals 24 hours/day If you have a medical emergency, go to the nearest emergency room or call 911.  FOLLOW UP in our office One the day of your discharge from the hospital (or the next business weekday), please call [facility] to set up or confirm an appointment to see your surgeon in the office for a follow-up appointment.  Usually it is 2-3 weeks after your surgery.   If you have skin staples at your incision(s), let the office know so we can set up a time in the office for the nurse to remove them (usually around 10 days after surgery). Make sure that  you call for appointments the day of discharge (or the next business weekday) from the hospital to ensure a convenient appointment time. IF YOU HAVE DISABILITY OR FAMILY LEAVE FORMS, BRING THEM TO THE OFFICE FOR PROCESSING.  DO NOT GIVE THEM TO YOUR DOCTOR.  [facility] [address], [place], [place], [place]  [address] ? [phone] - Main [phone] - [place],  [phone] - Fax [email or web address]  GETTING TO GOOD BOWEL HEALTH. It is expected for your digestive tract to need a few months to get back to normal.  It is common for your bowel movements and stools to be irregular.  You will have occasional bloating and cramping that should eventually fade away.  Until you are eating solid food normally, off all pain medications, and back to regular activities; your bowels will not be normal.   Avoiding constipation The goal: ONE SOFT BOWEL MOVEMENT A DAY!    Drink plenty of fluids.  Choose water first. TAKE A FIBER SUPPLEMENT EVERY DAY THE REST OF YOUR LIFE During your first week back home, gradually add back a fiber supplement every day Experiment which form you can tolerate.   There are many forms such as powders, tablets, wafers, gummies, etc Psyllium bran (Metamucil), methylcellulose (Citrucel), Miralax or Glycolax,  Benefiber, Flax Seed.  Adjust the dose week-by-week (1/2 dose/day to 6 doses a day) until you are moving your bowels 1-2 times a day.  Cut back the dose or try a different fiber product if it is giving you problems such as diarrhea or bloating. Sometimes a laxative is needed to help jump-start bowels if constipated until the fiber supplement can help regulate your bowels.  If you are tolerating eating & you are farting, it is okay to try a gentle laxative such as double dose MiraLax, prune juice, or Milk of Magnesia.  Avoid using laxatives too often. Stool softeners can sometimes help counteract the constipating effects of narcotic pain medicines.  It can also cause diarrhea, so avoid using for too long. If you are still constipated despite taking fiber daily, eating solids, and a few doses of laxatives, call our office. Controlling diarrhea Try drinking liquids and eating bland foods for a few days to avoid stressing your intestines further. Avoid dairy products (especially milk & ice cream) for a short time.  The intestines often can lose the ability to digest lactose when stressed. Avoid foods that cause gassiness or bloating.  Typical foods include beans and other legumes, cabbage, broccoli, and dairy foods.  Avoid greasy, spicy, fast foods.  Every person has some sensitivity to other foods, so listen to your body and avoid those foods that trigger problems for you. Probiotics (such as active yogurt, Align, etc) may help repopulate the intestines and colon with normal bacteria and calm down a sensitive digestive tract Adding a fiber supplement gradually can help thicken stools by absorbing excess fluid and retrain the intestines to act more normally.  Slowly increase the dose over a few weeks.  Too much fiber too soon can backfire and cause cramping & bloating. It is okay to try and slow down diarrhea with a few doses of antidiarrheal medicines.   Bismuth subsalicylate (ex. Kayopectate, Pepto Bismol)  for a few doses can help control diarrhea.  Avoid if pregnant.   Loperamide (Imodium) can slow down diarrhea.  Start with one tablet (2mg ) first.  Avoid if you are having fevers or severe pain.  ILEOSTOMY PATIENTS WILL HAVE CHRONIC DIARRHEA since  their colon is not in use.    Drink plenty of liquids.  You will need to drink even more glasses of water/liquid a day to avoid getting dehydrated. Record output from your ileostomy.  Expect to empty the bag every 3-4 hours at first.  Most people with a permanent ileostomy empty their bag 4-6 times at the least.   Use antidiarrheal medicine (especially Imodium) several times a day to avoid getting dehydrated.  Start with a dose at bedtime & breakfast.  Adjust up or down as needed.  Increase antidiarrheal medications as directed to avoid emptying the bag more than 8 times a day (every 3 hours). Work with your wound ostomy nurse to learn care for your ostomy.  See ostomy care instructions. TROUBLESHOOTING IRREGULAR BOWELS 1) Start with a soft & bland diet. No spicy, greasy, or fried foods.  2) Avoid gluten/wheat or dairy products from diet to see if symptoms improve. 3) Miralax 17gm or flax seed mixed in [place]. water or juice-daily. May use 2-4 times a day as needed. 4) Gas-X, Phazyme, etc. as needed for gas & bloating.  5) Prilosec (omeprazole) over-the-counter as needed 6)  Consider probiotics (Align, Activa, etc) to help calm the bowels down  Call your doctor if you are getting worse or not getting better.  Sometimes further testing (cultures, endoscopy, X-ray studies, CT scans, bloodwork, etc.) may be needed to help diagnose and treat the cause of the diarrhea. [facility], [place], [place], [place], [place]  [address] [phone] - Main.    [phone]  - Toll Free.   [phone] - Fax [email or web address]

## 2021-10-12 NOTE — Anesthesia Procedure Notes (Signed)
Procedure Name: Intubation Date/Time: 10/12/2021 10:50 AM Performed by: Claudia Desanctis, CRNA Pre-anesthesia Checklist: Patient identified, Emergency Drugs available, Suction available and Patient being monitored Patient Re-evaluated:Patient Re-evaluated prior to induction Oxygen Delivery Method: Circle system utilized Preoxygenation: Pre-oxygenation with 100% oxygen Induction Type: IV induction Ventilation: Mask ventilation without difficulty Laryngoscope Size: 2 and Miller Grade View: Grade I Tube type: Oral Tube size: 7.0 mm Number of attempts: 1 Airway Equipment and Method: Stylet Placement Confirmation: ETT inserted through vocal cords under direct vision, positive ETCO2 and breath sounds checked- equal and bilateral Secured at: 21 cm Tube secured with: Tape Dental Injury: Teeth and Oropharynx as per pre-operative assessment

## 2021-10-12 NOTE — Transfer of Care (Signed)
Immediate Anesthesia Transfer of Care Note  Patient: Carolyn Johns  Procedure(s) Performed: ROBOTIC RIGHT PROXIMAL COLECTOMY WITH BILATERAL TAP BLOCK (Abdomen)  Patient Location: PACU  Anesthesia Type:General  Level of Consciousness: awake and patient cooperative  Airway & Oxygen Therapy: Patient Spontanous Breathing and Patient connected to face mask  Post-op Assessment: Report given to RN and Post -op Vital signs reviewed and stable  Post vital signs: Reviewed and stable  Last Vitals:  Vitals Value Taken Time  BP 122/94 10/12/21 1300  Temp    Pulse 59 10/12/21 1302  Resp 20 10/12/21 1302  SpO2 100 % 10/12/21 1302  Vitals shown include unvalidated device data.  Last Pain:  Vitals:   10/12/21 0829  TempSrc:   PainSc: 0-No pain         Complications: No notable events documented.

## 2021-10-13 ENCOUNTER — Other Ambulatory Visit (HOSPITAL_COMMUNITY): Payer: Self-pay

## 2021-10-13 LAB — BASIC METABOLIC PANEL
Anion gap: 2 — ABNORMAL LOW (ref 5–15)
BUN: 19 mg/dL (ref 8–23)
CO2: 23 mmol/L (ref 22–32)
Calcium: 8.1 mg/dL — ABNORMAL LOW (ref 8.9–10.3)
Chloride: 111 mmol/L (ref 98–111)
Creatinine, Ser: 0.8 mg/dL (ref 0.44–1.00)
GFR, Estimated: >60 mL/min (ref >60)
Glucose, Bld: 133 mg/dL — ABNORMAL HIGH (ref 70–99)
Potassium: 3.6 mmol/L (ref 3.5–5.1)
Sodium: 136 mmol/L (ref 135–145)

## 2021-10-13 LAB — CBC
HCT: 32.1 % — ABNORMAL LOW (ref 36.0–46.0)
Hemoglobin: 10.6 g/dL — ABNORMAL LOW (ref 12.0–15.0)
MCH: 30 pg (ref 26.0–34.0)
MCHC: 33 g/dL (ref 30.0–36.0)
MCV: 90.9 fL (ref 80.0–100.0)
Platelets: 203 10*3/uL (ref 150–400)
RBC: 3.53 MIL/uL — ABNORMAL LOW (ref 3.87–5.11)
RDW: 13.7 % (ref 11.5–15.5)
WBC: 10.1 10*3/uL (ref 4.0–10.5)
nRBC: 0 % (ref 0.0–0.2)

## 2021-10-13 LAB — MAGNESIUM: Magnesium: 1.8 mg/dL (ref 1.7–2.4)

## 2021-10-13 MED ORDER — SODIUM CHLORIDE 0.9 % IV BOLUS
1000.0000 mL | Freq: Once | INTRAVENOUS | Status: AC
  Administered 2021-10-13: 1000 mL via INTRAVENOUS

## 2021-10-13 NOTE — Progress Notes (Signed)
Pt states feeling dizzy, nauseous and weak when standing. Unable to walk in halls. Orthostatic vitals taken(see flowsheets), BP dropped to 83/37 when standing. Marcello Moores MD notified via page, see new orders.

## 2021-10-13 NOTE — Progress Notes (Signed)
1 Day Post-Op Robotic R colectomy Subjective: Having some nausea and pain, no flatus or BM, ambulated in hall  Objective: Vital signs in last 24 hours: Temp:  [97.7 F (36.5 C)-99.2 F (37.3 C)] 98.3 F (36.8 C) (12/03 0518) Pulse Rate:  [56-87] 87 (12/03 0518) Resp:  [14-20] 18 (12/03 0518) BP: (86-137)/(51-94) 120/58 (12/03 0518) SpO2:  [91 %-100 %] 91 % (12/03 0518) Weight:  [56.2 kg] 56.2 kg (12/02 1442)   Intake/Output from previous day: 12/02 0701 - 12/03 0700 In: 3830.2 [P.O.:1557; I.V.:2073.2; IV Piggyback:200] Out: 2350 [Urine:2300; Blood:50] Intake/Output this shift: No intake/output data recorded.   General appearance: alert and cooperative GI: normal findings: soft, nondistended  Incision: no significant drainage  Lab Results:  Recent Labs    10/13/21 0530  WBC 10.1  HGB 10.6*  HCT 32.1*  PLT 203   BMET No results for input(s): NA, K, CL, CO2, GLUCOSE, BUN, CREATININE, CALCIUM in the last 72 hours. PT/INR No results for input(s): LABPROT, INR in the last 72 hours. ABG No results for input(s): PHART, HCO3 in the last 72 hours.  Invalid input(s): PCO2, PO2  MEDS, Scheduled  acetaminophen  1,000 mg Oral Q6H   alvimopan  12 mg Oral BID   enoxaparin (LOVENOX) injection  40 mg Subcutaneous Q24H   feeding supplement  237 mL Oral BID BM   levothyroxine  100 mcg Oral QAC breakfast   lip balm  1 application Topical BID   polycarbophil  625 mg Oral BID    Studies/Results: No results found.  Assessment: s/p Procedure(s): ROBOTIC RIGHT PROXIMAL COLECTOMY WITH BILATERAL TAP BLOCK Patient Active Problem List   Diagnosis Date Noted   COPD (chronic obstructive pulmonary disease) ([place]) 10/12/2021   GERD (gastroesophageal reflux disease) 10/12/2021   Mass of colon 10/12/2021   Hypothyroidism 01/15/2018   UTI (urinary tract infection) 01/15/2018   Elevated troponin 01/15/2018   Near syncope 01/15/2018   Tobacco abuse 01/15/2018   Pulmonary nodules  01/15/2018    Expected post op course  Plan: d/c foley Cont limited fulls until nausea improves Cont to ambulate    LOS: 1 day     .Rosario Adie, MD [facility], [place]    10/13/2021 8:25 AM

## 2021-10-14 MED ORDER — TRAMADOL HCL 50 MG PO TABS: 50.0000 mg | ORAL_TABLET | Freq: Four times a day (QID) | ORAL | 0 refills | Status: DC | PRN

## 2021-10-14 NOTE — Discharge Summary (Signed)
Physician Discharge Summary  Patient ID: Carolyn Johns MRN: [id number] DOB/AGE: 1956-07-23 65 y.o.  Admit date: 10/12/2021 Discharge date: 10/14/2021  Admission Diagnoses: Colon mass Discharge Diagnoses:  Principal Problem:   Mass of colon Active Problems:   Hypothyroidism   COPD (chronic obstructive pulmonary disease) (HCC)   GERD (gastroesophageal reflux disease)   Discharged Condition: good  Hospital Course: Patient was admitted to the med surg floor after surgery.  Diet was advanced as tolerated.  Patient began to have bowel function on postop day 1.  She did have sole orthostatic hypotension which was treated with IV fluids and resolved.  By postop day 2, She was tolerating a solid diet and pain was controlled with oral medications.  She was urinating without difficulty and ambulating without assistance.  Patient was felt to be in stable condition for discharge to home.   Consults: None  Significant Diagnostic Studies: labs: cbc, bmet  Treatments: IV hydration, analgesia: acetaminophen and tramadol, and surgery: robotic R colectomy  Discharge Exam: Blood pressure 126/69, pulse 93, temperature 98.8 F (37.1 C), temperature source Oral, resp. rate 14, height 5\' 3"  (1.6 m), weight 58 kg, SpO2 (!) 84 %. General appearance: alert and cooperative GI: soft, non-distended Incision/Wound: clean, dry, intact  Disposition: Discharge disposition: 01-Home or Self Care       Discharge Instructions     Call MD for:   Complete by: As directed    FEVER > 101.5 F  (temperatures < 101.5 F are not significant)   Call MD for:  extreme fatigue   Complete by: As directed    Call MD for:  persistant dizziness or light-headedness   Complete by: As directed    Call MD for:  persistant nausea and vomiting   Complete by: As directed    Call MD for:  redness, tenderness, or signs of infection (pain, swelling, redness, odor or green/yellow discharge around incision site)   Complete by:  As directed    Call MD for:  severe uncontrolled pain   Complete by: As directed    Diet - low sodium heart healthy   Complete by: As directed    Start with a bland diet such as soups, liquids, starchy foods, low fat foods, etc. the first few days at home. Gradually advance to a solid, low-fat, high fiber diet by the end of the first week at home.   Add a fiber supplement to your diet (Metamucil, etc) If you feel full, bloated, or constipated, stay on a full liquid or pureed/blenderized diet for a few days until you feel better and are no longer constipated.   Discharge instructions   Complete by: As directed    See Discharge Instructions If you are not getting better after two weeks or are noticing you are getting worse, contact our office [phone] for further advice.  We may need to adjust your medications, re-evaluate you in the office, send you to the emergency room, or see what other things we can do to help. The clinic staff is available to answer your questions during regular business hours (8:30am-5pm).  Please don't hesitate to call and ask to speak to one of our nurses for clinical concerns.    A surgeon from [facility] is always on call at the hospitals 24 hours/day If you have a medical emergency, go to the nearest emergency room or call 911.   Discharge wound care:   Complete by: As directed    It is good for  closed incisions and even open wounds to be washed every day.  Shower every day.  Short baths are fine.  Wash the incisions and wounds clean with soap & water.    You may leave closed incisions open to air if it is dry.   You may cover the incision with clean gauze & replace it after your daily shower for comfort.  TEGADERM:  You have clear gauze band-aid dressings over your closed incision(s).  Remove the dressings 3 days after surgery = 10/15/2021 Monday   Driving Restrictions   Complete by: As directed    You may drive when: - you are no longer taking  narcotic prescription pain medication - you can comfortably wear a seatbelt - you can safely make sudden turns/stops without pain.   Increase activity slowly   Complete by: As directed    Start light daily activities --- self-care, walking, climbing stairs- beginning the day after surgery.  Gradually increase activities as tolerated.  Control your pain to be active.  Stop when you are tired.  Ideally, walk several times a day, eventually an hour a day.   Most people are back to most day-to-day activities in a few weeks.  It takes 4-6 weeks to get back to unrestricted, intense activity. If you can walk 30 minutes without difficulty, it is safe to try more intense activity such as jogging, treadmill, bicycling, low-impact aerobics, swimming, etc. Save the most intensive and strenuous activity for last (Usually 4-8 weeks after surgery) such as sit-ups, heavy lifting, contact sports, etc.  Refrain from any intense heavy lifting or straining until you are off narcotics for pain control.  You will have off days, but things should improve week-by-week. DO NOT PUSH THROUGH PAIN.  Let pain be your guide: If it hurts to do something, don't do it.   Lifting restrictions   Complete by: As directed    If you can walk 30 minutes without difficulty, it is safe to try more intense activity such as jogging, treadmill, bicycling, low-impact aerobics, swimming, etc. Save the most intensive and strenuous activity for last (Usually 4-8 weeks after surgery) such as sit-ups, heavy lifting, contact sports, etc.   Refrain from any intense heavy lifting or straining until you are off narcotics for pain control.  You will have off days, but things should improve week-by-week. DO NOT PUSH THROUGH PAIN.  Let pain be your guide: If it hurts to do something, don't do it.  Pain is your body warning you to avoid that activity for another week until the pain goes down.   May shower / Bathe   Complete by: As directed    May walk up  steps   Complete by: As directed    Remove dressing in 72 hours   Complete by: As directed    Make sure all dressings are removed by the third day after surgery.  Leave incisions open to air.  OK to cover incisions with gauze or bandages as desired   Sexual Activity Restrictions   Complete by: As directed    You may have sexual intercourse when it is comfortable. If it hurts to do something, stop.      Allergies as of 10/14/2021       Reactions   Codeine    GI upset        Medication List     TAKE these medications    levothyroxine 100 MCG tablet Commonly known as: SYNTHROID Take 100 mcg by mouth daily  before breakfast.   melatonin 5 MG Tabs Take 10 mg by mouth at bedtime as needed (sleep).   traMADol 50 MG tablet Commonly known as: ULTRAM Take 1-2 tablets (50-100 mg total) by mouth every 6 (six) hours as needed for moderate or severe pain.               Discharge Care Instructions  (From admission, onward)           Start     Ordered   10/12/21 0000  Discharge wound care:       Comments: It is good for closed incisions and even open wounds to be washed every day.  Shower every day.  Short baths are fine.  Wash the incisions and wounds clean with soap & water.    You may leave closed incisions open to air if it is dry.   You may cover the incision with clean gauze & replace it after your daily shower for comfort.  TEGADERM:  You have clear gauze band-aid dressings over your closed incision(s).  Remove the dressings 3 days after surgery = 10/15/2021 Monday   10/12/21 1018            Follow-up Information     Ponce Boston, MD Follow up.   Specialties: General Surgery, Colon and Rectal Surgery Contact information: [address] [place] [address] [phone]                 Signed: Rosario Adie 00/05/1218, 8:43 AM

## 2021-10-14 NOTE — Progress Notes (Signed)
DC packet given to pt. DC instructions given to pt. IV removed. All questions answered. Pt taken downstairs by staff.

## 2021-10-15 ENCOUNTER — Other Ambulatory Visit (HOSPITAL_COMMUNITY): Payer: Self-pay

## 2021-10-15 LAB — SURGICAL PATHOLOGY

## 2022-06-11 ENCOUNTER — Emergency Department (HOSPITAL_COMMUNITY): Payer: 59

## 2022-06-11 ENCOUNTER — Emergency Department (HOSPITAL_BASED_OUTPATIENT_CLINIC_OR_DEPARTMENT_OTHER): Payer: 59

## 2022-06-11 ENCOUNTER — Encounter (HOSPITAL_COMMUNITY): Payer: Self-pay | Admitting: Emergency Medicine

## 2022-06-11 ENCOUNTER — Other Ambulatory Visit: Payer: Self-pay

## 2022-06-11 ENCOUNTER — Emergency Department (HOSPITAL_COMMUNITY)
Admission: EM | Admit: 2022-06-11 | Discharge: 2022-06-11 | Payer: 59 | Attending: Emergency Medicine | Admitting: Emergency Medicine

## 2022-06-11 DIAGNOSIS — X501XXA Overexertion from prolonged static or awkward postures, initial encounter: Secondary | ICD-10-CM | POA: Insufficient documentation

## 2022-06-11 DIAGNOSIS — M79661 Pain in right lower leg: Secondary | ICD-10-CM | POA: Insufficient documentation

## 2022-06-11 DIAGNOSIS — S82141A Displaced bicondylar fracture of right tibia, initial encounter for closed fracture: Secondary | ICD-10-CM | POA: Diagnosis not present

## 2022-06-11 DIAGNOSIS — J449 Chronic obstructive pulmonary disease, unspecified: Secondary | ICD-10-CM | POA: Diagnosis not present

## 2022-06-11 DIAGNOSIS — M7989 Other specified soft tissue disorders: Secondary | ICD-10-CM | POA: Diagnosis not present

## 2022-06-11 DIAGNOSIS — M79604 Pain in right leg: Secondary | ICD-10-CM

## 2022-06-11 DIAGNOSIS — S8991XA Unspecified injury of right lower leg, initial encounter: Secondary | ICD-10-CM | POA: Diagnosis present

## 2022-06-11 MED ORDER — OXYCODONE-ACETAMINOPHEN 5-325 MG PO TABS: 1.0000 | ORAL_TABLET | Freq: Three times a day (TID) | ORAL | 0 refills | Status: AC | PRN

## 2022-06-11 MED ORDER — KETOROLAC TROMETHAMINE 15 MG/ML IJ SOLN
15.0000 mg | Freq: Once | INTRAMUSCULAR | Status: AC
  Administered 2022-06-11: 15 mg via INTRAMUSCULAR
  Filled 2022-06-11: qty 1

## 2022-06-11 NOTE — Discharge Instructions (Signed)
You came to the emergency room today to be evaluated for your right knee pain.  Your imaging showed a tibial plateau fracture.  Due to this you were placed in a knee immobilizer and are to continue using it until you can follow-up with the orthopedic provider.  You can weight-bear on your right leg as tolerated.  Please use a walker or crutches if you are unable to bear weight on your affected limb.  Please call Dr. Stann Mainland office to schedule a follow-up appointment for further management of your fracture.  You are being prescribed a medication which may make you sleepy or mpair your ability to make decisions.  For 24 hours after taking this medication please do not drive, operate heavy machinery, care for a small child with out another adult present, or perform any activities that may cause harm to you or someone else if you were to fall asleep or be impaired.   Get help right away if: You have severe pain or swelling. You have new pain, swelling, or warmth in your lower leg. Your toes or foot: Are unusually cold. Turn a bluish color. Are numb. You have chest pain. You have difficulty breathing.

## 2022-06-11 NOTE — ED Triage Notes (Signed)
Pt reports right knee pain w/ knots. Pt reports she did half some calf pain as well but that has subsided.

## 2022-06-11 NOTE — ED Provider Triage Note (Signed)
Emergency Medicine Provider Triage Evaluation Note  Carolyn Johns , a 66 y.o. female  was evaluated in triage.  Pt complains of pain to right knee.  Pain started in right popliteal 3 to 4 weeks ago.  Pain has been consistent since then.  Patient states that pain started wrapping around to her anterior knee yesterday.  Pain has become much worse in the last 24 hours.  Patient is now having difficulty with ambulation.  Patient denies any recent falls or traumatic injuries.  Review of Systems  Positive: Right knee pain Negative: Chest pain, shortness of breath, hemoptysis, numbness, weakness, pallor, color change, wound  Physical Exam  BP 108/74 (BP Location: Left Arm)   Pulse 88   Temp 98.3 F (36.8 C) (Oral)   Resp 18   SpO2 99%  Gen:   Awake, no distress   Resp:  Normal effort  MSK:   Moves extremities without difficulty  Other:  Tenderness to right popliteal area.  Diffuse tenderness to right anterior knee.  Patient has full range of motion to affected knee however does complain of pain with range of motion.  Patient is able to stand and ambulate with antalgic gait.  Medical Decision Making  Medically screening exam initiated at 7:41 AM.  Appropriate orders placed.  Carolyn Johns was informed that the remainder of the evaluation will be completed by another provider, this initial triage assessment does not replace that evaluation, and the importance of remaining in the ED until their evaluation is complete.     Loni Beckwith, [place] 06/11/22 [phone]

## 2022-06-11 NOTE — ED Provider Notes (Signed)
[facility] DEPT Provider Note   CSN: [id number] Arrival date & time: 06/11/22  2542     History  Chief Complaint  Patient presents with   Knee Pain    Carolyn Johns is a 66 y.o. female with past medical history of thyroid disease, COPD, GERD.  Presents to the emergency department with complaint of right knee pain.  Patient reports that pain is started 3 to 4 weeks prior.  Pain was originally located to her right calf and right popliteal area.  Over the last 24 to 48 hours pain began radiating into the medial and anterior aspect of her right knee.  Pain has come progressively worse over the last 24 to 48 hours.  Patient denies any recent falls or traumatic injuries.  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  Patient denies any chest pain, shortness of breath, hemoptysis, pallor, wound, color change, numbness, weakness.   Knee Pain Associated symptoms: no back pain, no fever and no neck pain        Home Medications Prior to Admission medications   Medication Sig Start Date End Date Taking? Authorizing Provider  levothyroxine (SYNTHROID, LEVOTHROID) 100 MCG tablet Take 100 mcg by mouth daily before breakfast. 12/24/17   [provider]  Melatonin 5 MG TABS Take 10 mg by mouth at bedtime as needed (sleep).    [provider]  traMADol (ULTRAM) 50 MG tablet Take 1-2 tablets (50-100 mg total) by mouth every 6 (six) hours as needed for moderate or severe pain. 70/6/23   Leighton Ruff, MD      Allergies    Codeine    Review of Systems   Review of Systems  Constitutional:  Negative for chills and fever.  Respiratory:  Negative for shortness of breath.   Cardiovascular:  Negative for chest pain.  Gastrointestinal:  Negative for abdominal pain, nausea and vomiting.  Musculoskeletal:  Positive for arthralgias and myalgias. Negative for back pain, joint swelling and neck pain.   Skin:  Negative for color change and rash.  Neurological:  Negative for dizziness, syncope, weakness, light-headedness, numbness and headaches.  Psychiatric/Behavioral:  Negative for confusion.     Physical Exam Updated Vital Signs BP 108/74 (BP Location: Left Arm)   Pulse 88   Temp 98.3 F (36.8 C) (Oral)   Resp 18   SpO2 99%  Physical Exam Vitals and nursing note reviewed.  Constitutional:      General: She is not in acute distress.    Appearance: She is not ill-appearing, toxic-appearing or diaphoretic.  HENT:     Head: Normocephalic.  Eyes:     General: No scleral icterus.       Right eye: No discharge.        Left eye: No discharge.  Cardiovascular:     Rate and Rhythm: Normal rate.     Pulses:          Dorsalis pedis pulses are 2+ on the right side.  Pulmonary:     Effort: Pulmonary effort is normal.  Musculoskeletal:     Right knee: Bony tenderness present. No swelling, deformity, effusion, erythema, ecchymosis, lacerations or crepitus. Normal range of motion. Tenderness present over the medial joint line. Normal alignment.     Left knee: No swelling, deformity, effusion, erythema, ecchymosis, lacerations, bony tenderness or crepitus. Normal range of motion. No tenderness. Normal alignment.     Right lower leg: Normal.  Left lower leg: Normal.     Right ankle: No swelling, deformity, ecchymosis or lacerations. No tenderness. Normal range of motion.     Left ankle: No swelling, deformity, ecchymosis or lacerations. No tenderness. Normal range of motion.     Right foot: Normal range of motion and normal capillary refill. No swelling, deformity, laceration, tenderness, bony tenderness or crepitus. Normal pulse.     Comments: Full range of motion to right knee.  Patient has pain on range of motion.  Tenderness to right patella, right popliteal area, and right medial joint line.  Patient is able to stand and ambulate however does have antalgic gait.  Skin:    General:  Skin is warm and dry.  Neurological:     General: No focal deficit present.     Mental Status: She is alert.  Psychiatric:        Behavior: Behavior is cooperative.     ED Results / Procedures / Treatments   Labs (all labs ordered are listed, but only abnormal results are displayed) Labs Reviewed - No data to display  EKG None  Radiology MR KNEE RIGHT WO CONTRAST  Result Date: 06/11/2022 CLINICAL DATA:  Medial knee pain for 2 weeks. No known injury. Occult fracture suspected. EXAM: MRI OF THE RIGHT KNEE WITHOUT CONTRAST TECHNIQUE: Multiplanar, multisequence MR imaging of the knee was performed. No intravenous contrast was administered. COMPARISON:  Radiographs 06/11/2022. FINDINGS: Despite efforts by the technologist and patient, mild motion artifact is present on today's exam and could not be eliminated. This reduces exam sensitivity and specificity. MENISCI Medial meniscus: Irregular degenerative tearing of the posterior horn, including the meniscal root. The meniscus is partially extruded peripherally from the joint. No centrally displaced meniscal fragments are identified. Lateral meniscus:  Intact with normal morphology. LIGAMENTS Cruciates:  Intact. Collaterals:  Intact. CARTILAGE Patellofemoral: Focal fissuring of the patellar cartilage at the apex. Mild thinning of the cartilage over the medial facet without subchondral signal abnormality. Medial: Moderate chondral thinning and surface irregularity with subchondral cyst formation in the medial femoral condyle, including the posterior nonweightbearing aspect. Lateral:  Preserved. MISCELLANEOUS Joint:  No significant joint effusion. Popliteal Fossa: The popliteus muscle and tendon are intact. Small Baker's cyst. Extensor Mechanism:  Intact. Bones: There is a subchondral insufficiency fracture of the medial tibial plateau with associated surrounding bone marrow edema. No evidence of fracture of the medial femoral condyle. Other: Trace fluid  in the pes anserine bursa with medial soft tissue edema adjacent to the insufficiency fracture of the medial tibial plateau. IMPRESSION: 1. Subchondral insufficiency fracture of the medial tibial plateau. 2. Underlying mild to moderate medial compartment degenerative changes and degenerative tearing of the posterior horn of the medial meniscus. 3. Intact lateral meniscus, cruciate and collateral ligaments. Small Baker's cyst. Electronically Signed   By: Richardean Sale M.D.   On: 06/11/2022 09:59   VAS [place] LOWER EXTREMITY VENOUS (DVT) (ONLY [facility] & [facility])  Result Date: 06/11/2022  Lower Venous DVT Study Patient Name:  TERENA BOHAN  Date of Exam:   06/11/2022 Medical Rec #: [id number]        Accession #:    [id number] Date of Birth: Nov 09, 1956        Patient Gender: F Patient Age:   38 years Exam Location:  [facility] Procedure:      VAS [place] LOWER EXTREMITY VENOUS (DVT) Referring Phys: Debbe Mounts --------------------------------------------------------------------------------  Indications: Pain and swelling of right knee for 3-4 weeks.  Comparison Study: Previous  exam on 01/16/18 was negative for DVT Performing Technologist: Rogelia Rohrer RVT, RDMS  Examination Guidelines: A complete evaluation includes B-mode imaging, spectral Doppler, color Doppler, and power Doppler as needed of all accessible portions of each vessel. Bilateral testing is considered an integral part of a complete examination. Limited examinations for reoccurring indications may be performed as noted. The reflux portion of the exam is performed with the patient in reverse Trendelenburg.  +---------+---------------+---------+-----------+----------+--------------+ RIGHT    CompressibilityPhasicitySpontaneityPropertiesThrombus Aging +---------+---------------+---------+-----------+----------+--------------+ CFV      Full           Yes      Yes                                  +---------+---------------+---------+-----------+----------+--------------+ SFJ      Full                                                        +---------+---------------+---------+-----------+----------+--------------+ FV Prox  Full           Yes      Yes                                 +---------+---------------+---------+-----------+----------+--------------+ FV Mid   Full           Yes      Yes                                 +---------+---------------+---------+-----------+----------+--------------+ FV DistalFull           Yes      Yes                                 +---------+---------------+---------+-----------+----------+--------------+ PFV      Full                                                        +---------+---------------+---------+-----------+----------+--------------+ POP      Full           Yes      Yes                                 +---------+---------------+---------+-----------+----------+--------------+ PTV      Full                                                        +---------+---------------+---------+-----------+----------+--------------+ PERO     Full                                                        +---------+---------------+---------+-----------+----------+--------------+   +----+---------------+---------+-----------+----------+--------------+  LEFTCompressibilityPhasicitySpontaneityPropertiesThrombus Aging +----+---------------+---------+-----------+----------+--------------+ CFV Full           Yes      Yes                                 +----+---------------+---------+-----------+----------+--------------+     Summary: RIGHT: - No evidence of deep vein thrombosis in the lower extremity. No indirect evidence of obstruction proximal to the inguinal ligament. - No cystic structure found in the popliteal fossa.  LEFT: - No evidence of common femoral vein obstruction.  *See table(s) above for measurements  and observations.    Preliminary    DG Knee Complete 4 Views Right  Result Date: 06/11/2022 CLINICAL DATA:  Right knee pain, worsening for 1 day, no reported injury EXAM: RIGHT KNEE - COMPLETE 4+ VIEW COMPARISON:  None Available. FINDINGS: No fracture, joint effusion or dislocation. No suspicious focal osseous lesions. Minimal medial compartment osteoarthritis. Subtle small subchondral focus of mixed lucent and sclerotic change in the medial distal right femoral condyle. No radiopaque foreign bodies. IMPRESSION: 1. Subtle small subchondral focus of mixed lucent and sclerotic change in the medial distal right femoral condyle, potentially indicating a small osteochondral lesion/insufficiency fracture. MRI of the right knee suggested for further evaluation. 2. Minimal medial compartment right knee osteoarthritis. Electronically Signed   By: Ilona Sorrel M.D.   On: 06/11/2022 08:06    Procedures Procedures    Medications Ordered in ED Medications  ketorolac (TORADOL) 15 MG/ML injection 15 mg (15 mg Intramuscular Given 06/11/22 1205)    ED Course/ Medical Decision Making/ A&P Clinical Course as of 06/11/22 1249  Tue Jun 11, 2022  1130 I spoke to orthopedic provider Dr. Rolena Infante who reviewed patient's imaging.  Advised to place patient in a knee immobilizer and sent home with a walker to be weightbearing as tolerated.  Patient will follow-up with Dr. Stann Mainland in 2 weeks. [PB]    Clinical Course User Index [PB] Loni Beckwith, PA-C                           Medical Decision Making Amount and/or Complexity of Data Reviewed Radiology: ordered.  Risk Prescription drug management.   Alert 66 year old female in no acute distress, nontoxic-appearing.  Presents to the ED with a chief complaint of right knee pain.  Information was obtained from patient.  I reviewed patient's past medical record including previous prior notes, labs, and imaging.  Patient has medical history as outlined in HPI  complicates her care.  Due to reports of knee pain worsening last 24 hours with analgesic a concern for possible osseous abnormality.  Will obtain x-ray imaging for further evaluation.  Due to patient starting in right calf and popliteal area concern for possible Baker's cyst versus DVT; will obtain ultrasound imaging for further evaluation.  I personally viewed interpret patient's x-ray imaging.  Agree with radiology interpretation of subtle small solid contralateral focus of mixed lucent and sclerotic changes in the medial distal right femoral condyle potentially indicating possible osteochondral lesion/insufficiency fracture.  Minimal medial compartment right knee osteoarthritis  Ultrasound imaging of right lower extremity showed no signs of cystic structures or DVT.  Due to findings of x-ray will obtain MRI to look for osteochondral lesion/insufficiency fracture.  MRI imaging shows subchondral insufficiency fracture of the medial tibial plateau.  Due to findings on MRI I reached out to on-call orthopedic provider.  I spoke  with Dr. Rolena Infante.  Please see above note for further details on our conversation.  We will discharge patient with a short course of Percocet pain medication.  Patient to go home with knee immobilizer and walker to be nonweightbearing as needed.  Patient to follow-up with orthopedic provider in the outpatient setting.  Strict return precautions were stressed to the patient.  Based on patient's chief complaint, I considered admission might be necessary, however after reassuring ED workup feel patient is reasonable for discharge.  Discussed results, findings, treatment and follow up. Patient advised of return precautions. Patient verbalized understanding and agreed with plan.  Portions of this note were generated with [employer]. Dictation errors may occur despite best attempts at proofreading.         Final Clinical Impression(s) / ED Diagnoses Final  diagnoses:  Closed fracture of right tibial plateau, initial encounter    Rx / DC Orders ED Discharge Orders          Ordered    oxyCODONE-acetaminophen (PERCOCET/ROXICET) 5-325 MG tablet  Every 8 hours PRN        06/11/22 1144              Dyann Ruddle 06/11/22 1251    Blanchie Dessert, MD 06/11/22 1432

## 2022-06-11 NOTE — Progress Notes (Signed)
RLE venous duplex has been completed.  Preliminary results given to Dr. Maryan Rued.   Results can be found under chart review under CV PROC. 06/11/2022 9:03 AM Setsuko Robins RVT, RDMS

## 2024-11-22 ENCOUNTER — Other Ambulatory Visit: Payer: Self-pay | Admitting: Medical

## 2024-11-22 DIAGNOSIS — R109 Unspecified abdominal pain: Secondary | ICD-10-CM

## 2024-11-26 ENCOUNTER — Other Ambulatory Visit: Payer: Self-pay | Admitting: Medical

## 2024-11-26 ENCOUNTER — Encounter: Payer: Self-pay | Admitting: Medical

## 2024-11-26 ENCOUNTER — Ambulatory Visit
Admission: RE | Admit: 2024-11-26 | Discharge: 2024-11-26 | Disposition: A | Discharge status: Home / Self Care | Source: Ambulatory Visit | Attending: Medical | Admitting: Medical

## 2024-11-26 DIAGNOSIS — R109 Unspecified abdominal pain: Secondary | ICD-10-CM

## 2024-11-30 ENCOUNTER — Ambulatory Visit
Admission: RE | Admit: 2024-11-30 | Discharge: 2024-11-30 | Disposition: A | Source: Ambulatory Visit | Attending: Medical

## 2024-11-30 DIAGNOSIS — R109 Unspecified abdominal pain: Secondary | ICD-10-CM
